# Patient Record
Sex: Female | Born: 1985
Health system: Southern US, Community
[De-identification: ages and names within clinical notes are randomized; demographics above are authoritative.]

## PROBLEM LIST (undated history)

## (undated) DIAGNOSIS — L989 Disorder of the skin and subcutaneous tissue, unspecified: Secondary | ICD-10-CM

## (undated) DIAGNOSIS — D649 Anemia, unspecified: Secondary | ICD-10-CM

## (undated) HISTORY — DX: Disorder of the skin and subcutaneous tissue, unspecified: L98.9

## (undated) HISTORY — PX: VAGINA SURGERY: SHX829

---

## 2010-01-19 ENCOUNTER — Ambulatory Visit: Payer: Self-pay | Admitting: Family

## 2010-06-12 ENCOUNTER — Inpatient Hospital Stay: Payer: Self-pay | Admitting: Obstetrics and Gynecology

## 2012-02-05 IMAGING — US US OB US >=[ID] SNGL FETUS
1 series · 17 of 28 positions shown · non-contrast
Comparison: none

REASON FOR EXAM: dates anatomy placenta location
COMMENTS:

[Series 1: us ob us >=(id) sngl fetus · 17 of 61 slices shown]
[im 1/61]
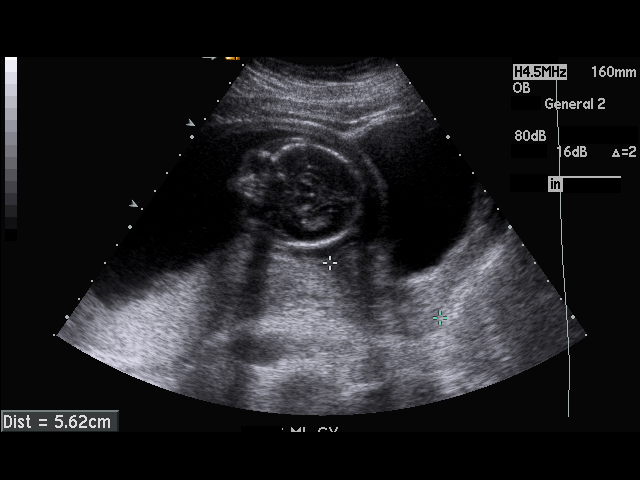
[im 5/61]
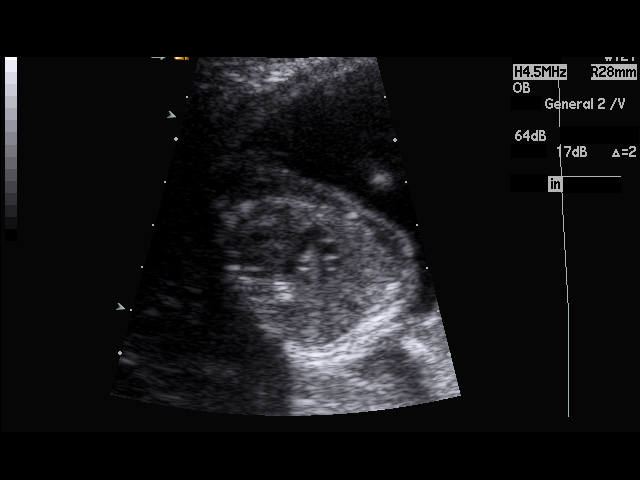
[im 9/61]
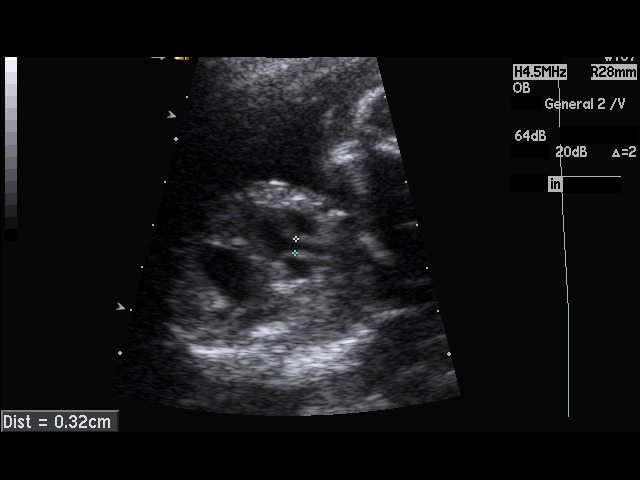
[im 12/61]
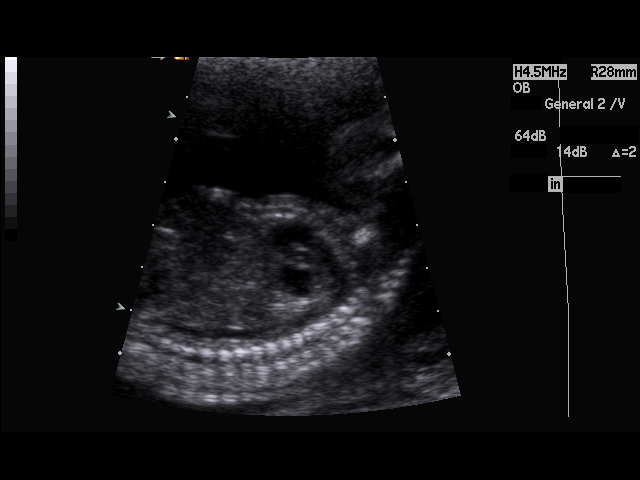
[im 16/61]
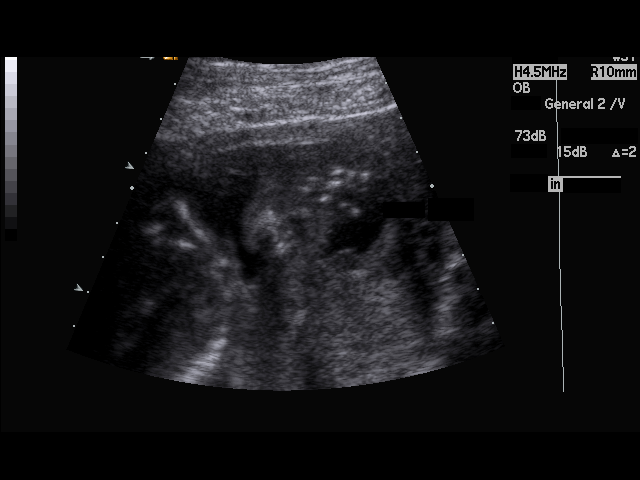
[im 21/61]
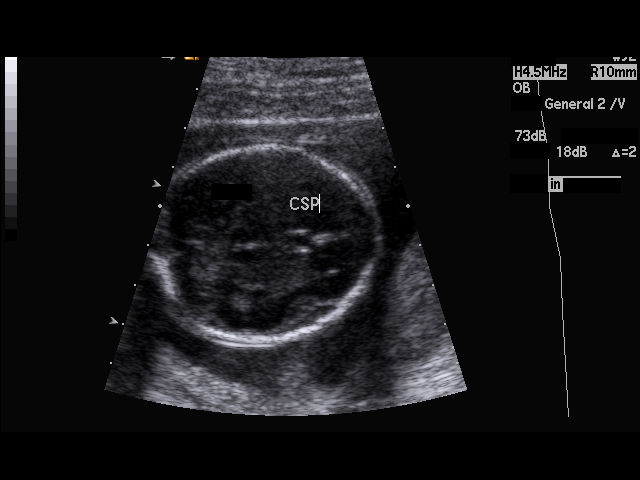
[im 23/61]
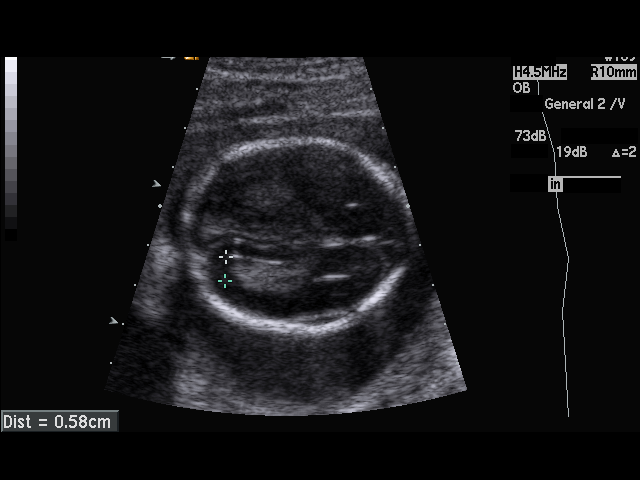
[im 27/61]
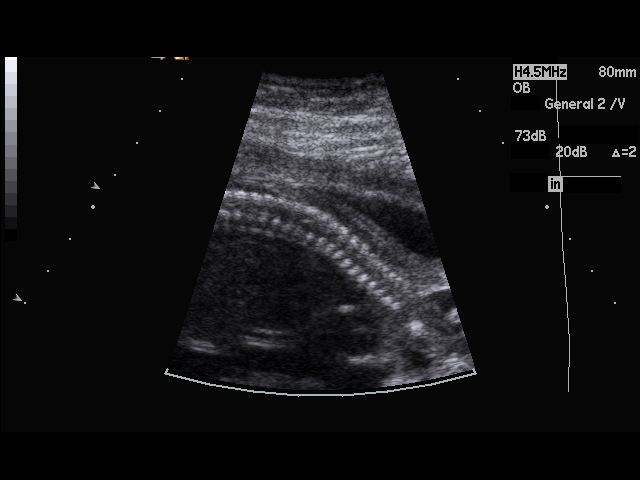
[im 32/61]
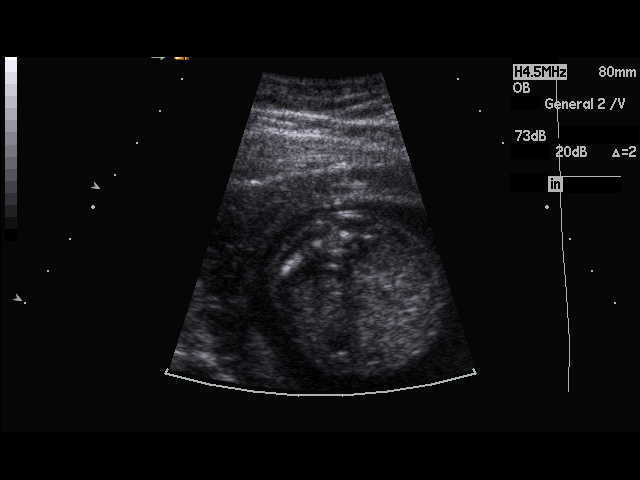
[im 34/61]
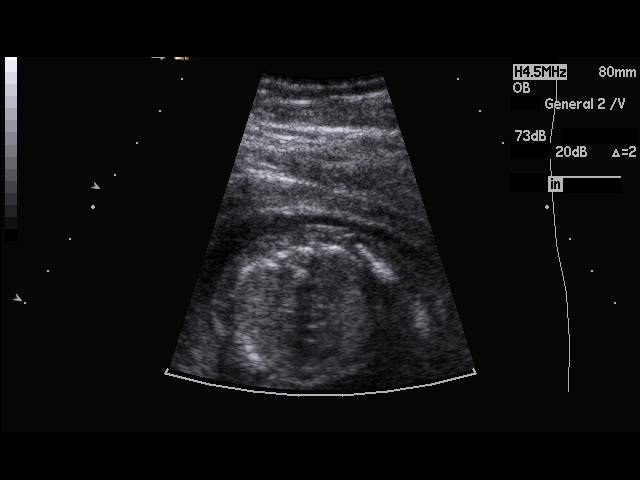
[im 38/61]
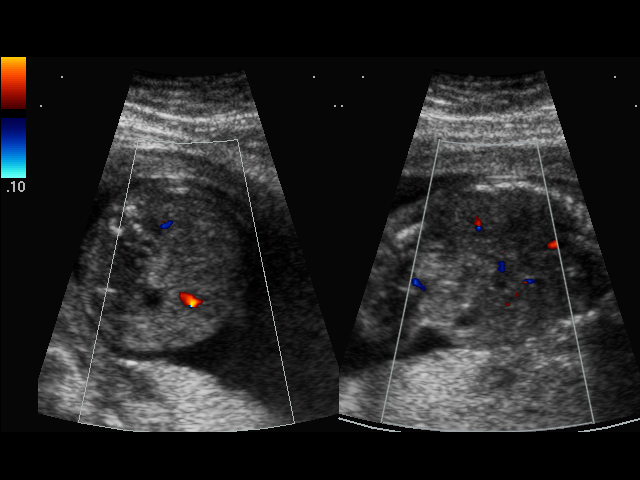
[im 41/61]
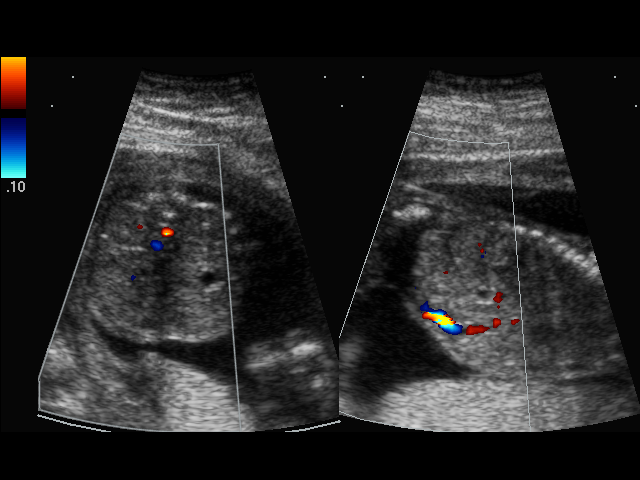
[im 45/61]
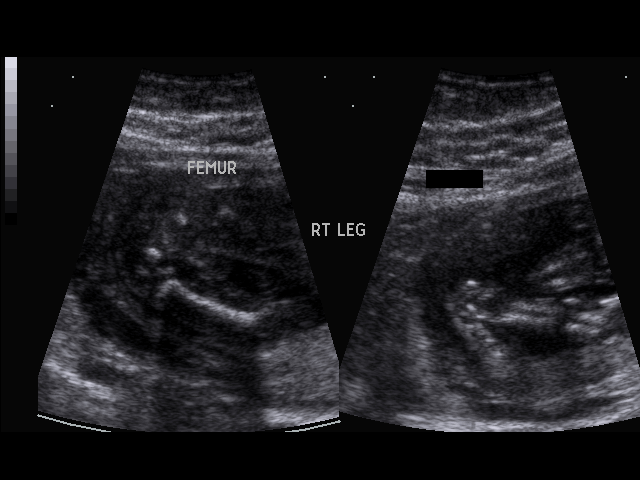
[im 49/61]
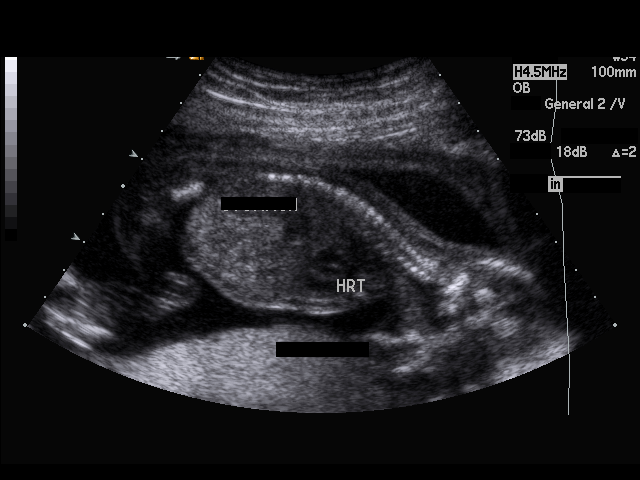
[im 52/61]
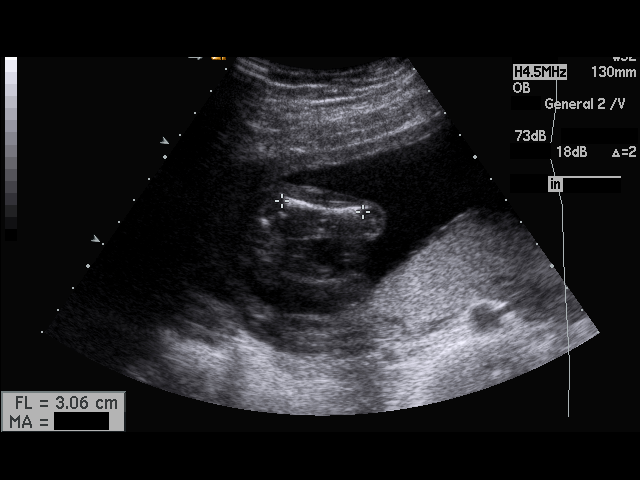
[im 56/61]
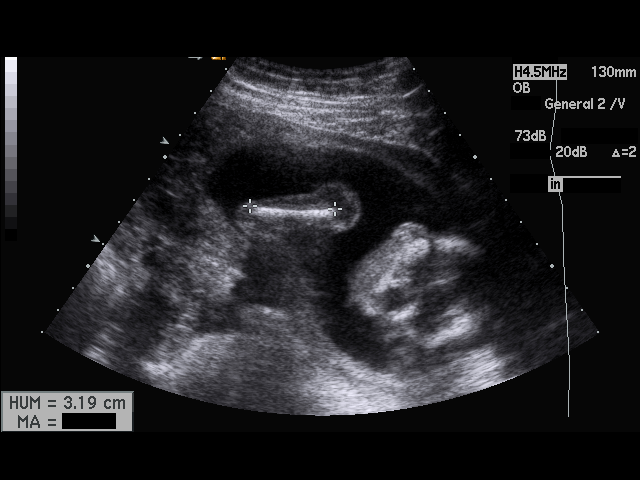
[im 61/61]
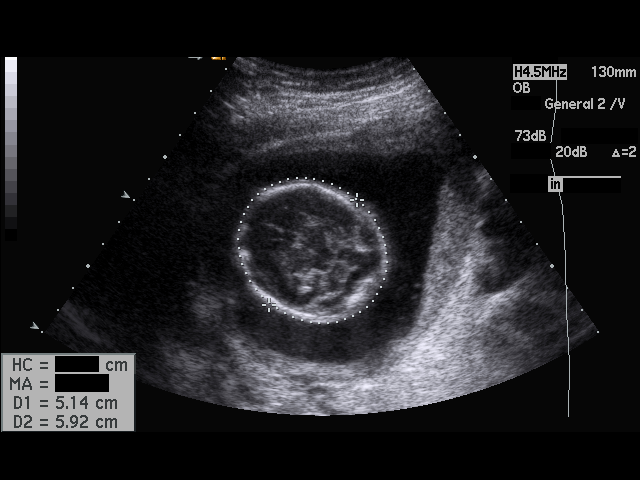

[17 of 28 positions shown; findings below may reference images not displayed]

PROCEDURE:     US  - US OB GREATER/OR EQUAL TO I7VUB  - January 19, 2010  [DATE]

RESULT:     There is observed a single living intrauterine gestation.
Presentation currently is cephalic. Fetal heart rate was monitored at 150
beats per minute. The amniotic fluid volume appears normal. The placenta is
posterior. The inferior tip of the placenta is approximately 4.2 cm from the
cervix. The fetal heart, stomach, and urinary bladder are visualized. No
hydrocephalus or hydronephrosis is seen. No fetal abnormalities are
detected. Fetal measurements are as follows:

BPD: 4.59 cm corresponding to 19 weeks-1 days
HC: 17.39 cm corresponding to 19 weeks-1 days
AC: 15.15 cm corresponding to 20 weeks-7 days
FL: 3.11 cm corresponding to 19 weeks-7 days
HL: 3.13 cm corresponding to 20 weeks-7 days

EFW is 353 grams + / - 47 grams. Average ultrasound menstrual age is 20
weeks-0 days. Ultrasound EDD is June 08, 2010.
IMPRESSION: 1.     Please see above.

## 2013-08-28 LAB — HIV ANTIBODY (ROUTINE TESTING W REFLEX): HIV 1&2 Ab, 4th Generation: NEGATIVE

## 2014-02-23 ENCOUNTER — Inpatient Hospital Stay: Payer: Self-pay | Admitting: Obstetrics and Gynecology

## 2014-02-23 LAB — CBC WITH DIFFERENTIAL/PLATELET
Basophil #: 0.1 10*3/uL (ref 0.0–0.1)
Basophil %: 0.6 %
EOS ABS: 0.1 10*3/uL (ref 0.0–0.7)
Eosinophil %: 0.5 %
HCT: 37.1 % (ref 35.0–47.0)
HGB: 11.8 g/dL — AB (ref 12.0–16.0)
LYMPHS PCT: 13.3 %
Lymphocyte #: 1.5 10*3/uL (ref 1.0–3.6)
MCH: 29.6 pg (ref 26.0–34.0)
MCHC: 31.7 g/dL — AB (ref 32.0–36.0)
MCV: 93 fL (ref 80–100)
Monocyte #: 0.6 x10 3/mm (ref 0.2–0.9)
Monocyte %: 5.3 %
NEUTROS ABS: 9 10*3/uL — AB (ref 1.4–6.5)
Neutrophil %: 80.3 %
Platelet: 148 10*3/uL — ABNORMAL LOW (ref 150–440)
RBC: 3.98 10*6/uL (ref 3.80–5.20)
RDW: 13.7 % (ref 11.5–14.5)
WBC: 11.2 10*3/uL — ABNORMAL HIGH (ref 3.6–11.0)

## 2014-02-23 LAB — GC/CHLAMYDIA PROBE AMP

## 2014-02-24 LAB — HEMATOCRIT: HCT: 29.5 % — ABNORMAL LOW (ref 35.0–47.0)

## 2014-10-14 NOTE — H&P (Signed)
L&D Evaluation:  History:  HPI [age] y/o G2P1001 arrived with c/o regular contractions and SROM clear fluid at 0545. Small amount bloody show, baby is active. [facility]. GBS negative.   Presents with contractions, leaking fluid   Patient's Medical History No Chronic Illness   Patient's Surgical History none   Medications Pre Natal Vitamins   Allergies NKDA   Social History none   Family History Non-Contributory   ROS:  ROS All systems were reviewed.  HEENT, CNS, GI, GU, Respiratory, CV, Renal and Musculoskeletal systems were found to be normal.   Exam:  Vital Signs stable   Urine Protein not completed   General no apparent distress   Mental Status clear   Chest clear   Heart normal sinus rhythm   Abdomen gravid, non-tender   Fetal Position vtx   Fundal Height term   Back no CVAT   Edema no edema   Reflexes 1+   Clonus negative   Pelvic no external lesions, 4-5cm 100% vtx @ -1 clear fluid nl show upon admission   Mebranes Ruptured   Description clear   FHT normal rate with no decels, baseline 120's 130's avg variability   Fetal Heart Rate 132   Ucx regular   Ucx Frequency 2 min   Skin dry   Impression:  Impression active labor   Plan:  Plan monitor contractions and for cervical change   Comments Admitted, knows what to expect, 2nd baby. Family at bedside supportive. Declines pain meds breathing through well.   Electronic Signatures: Albertina ParrLugiano, Kamran Coker B (CNM)  (Signed 20-Sep-15 09:25)  Authored: L&D Evaluation   Last Updated: 20-Sep-15 09:25 by Albertina ParrLugiano, Julliette Frentz B (CNM)

## 2015-11-23 DIAGNOSIS — L700 Acne vulgaris: Secondary | ICD-10-CM | POA: Diagnosis not present

## 2016-03-14 DIAGNOSIS — L700 Acne vulgaris: Secondary | ICD-10-CM | POA: Diagnosis not present

## 2016-06-21 DIAGNOSIS — H5213 Myopia, bilateral: Secondary | ICD-10-CM | POA: Diagnosis not present

## 2016-06-30 DIAGNOSIS — Z124 Encounter for screening for malignant neoplasm of cervix: Secondary | ICD-10-CM | POA: Diagnosis not present

## 2016-06-30 DIAGNOSIS — Z1389 Encounter for screening for other disorder: Secondary | ICD-10-CM | POA: Diagnosis not present

## 2016-06-30 DIAGNOSIS — Z01419 Encounter for gynecological examination (general) (routine) without abnormal findings: Secondary | ICD-10-CM | POA: Diagnosis not present

## 2016-06-30 LAB — HM PAP SMEAR: HM Pap smear: NEGATIVE

## 2017-06-05 DIAGNOSIS — H0014 Chalazion left upper eyelid: Secondary | ICD-10-CM | POA: Diagnosis not present

## 2017-06-23 DIAGNOSIS — H0014 Chalazion left upper eyelid: Secondary | ICD-10-CM | POA: Diagnosis not present

## 2017-10-11 DIAGNOSIS — L0201 Cutaneous abscess of face: Secondary | ICD-10-CM | POA: Diagnosis not present

## 2017-11-16 DIAGNOSIS — Z01419 Encounter for gynecological examination (general) (routine) without abnormal findings: Secondary | ICD-10-CM | POA: Diagnosis not present

## 2018-02-01 ENCOUNTER — Other Ambulatory Visit: Payer: Self-pay

## 2018-02-01 ENCOUNTER — Encounter: Payer: Self-pay | Admitting: Family Medicine

## 2018-02-01 ENCOUNTER — Ambulatory Visit (INDEPENDENT_AMBULATORY_CARE_PROVIDER_SITE_OTHER): Payer: No Typology Code available for payment source | Admitting: Family Medicine

## 2018-02-01 VITALS — BP 102/67 | HR 69 | Temp 98.8°F | Ht 62.0 in | Wt 165.0 lb

## 2018-02-01 DIAGNOSIS — R51 Headache: Secondary | ICD-10-CM | POA: Diagnosis not present

## 2018-02-01 DIAGNOSIS — Z7689 Persons encountering health services in other specified circumstances: Secondary | ICD-10-CM

## 2018-02-01 DIAGNOSIS — H04123 Dry eye syndrome of bilateral lacrimal glands: Secondary | ICD-10-CM | POA: Diagnosis not present

## 2018-02-01 DIAGNOSIS — R519 Headache, unspecified: Secondary | ICD-10-CM

## 2018-02-01 DIAGNOSIS — L700 Acne vulgaris: Secondary | ICD-10-CM | POA: Insufficient documentation

## 2018-02-01 MED ORDER — SUMATRIPTAN SUCCINATE 50 MG PO TABS: 50.0000 mg | ORAL_TABLET | ORAL | 0 refills | Status: DC | PRN

## 2018-02-01 MED ORDER — BENZOYL PEROXIDE 5 % EX LIQD: Freq: Two times a day (BID) | CUTANEOUS | 12 refills | Status: DC

## 2018-02-01 MED ORDER — CYCLOSPORINE 0.05 % OP EMUL: 1.0000 [drp] | Freq: Two times a day (BID) | OPHTHALMIC | 11 refills | Status: DC

## 2018-02-01 NOTE — Progress Notes (Signed)
BP 102/67   Pulse 69   Temp 98.8 F (37.1 C) (Oral)   Ht 5\' 2"  (1.575 m)   Wt 165 lb (74.8 kg)   SpO2 99%   BMI 30.18 kg/m    Subjective:    Patient ID: Meagan White, female    DOB: [birth year], 32 y.o.   MRN: [id number]  HPI: Meagan White is a 32 y.o. female  Chief Complaint  Patient presents with  . New Patient (Initial Visit)    pt would like to discuss dry eyes and headaches   Here today to establish care. No known medical problems and not currently taking any medications or supplements. Has several concerns today.  Headaches once every other week lasting a few hours to a few days at worst. Sometimes triggered by temperature, sometimes by neck soreness. Seems to be at both temples most of the time. Taking ibuprofen which seems to help. Maybe once a year she will vomit with a severe headache but usually no photophobia, phonophobia, confusion, dizziness.   Previously treated for acne by Dermatology. Was on tretinoin cream and antibiotics which helped. Planning to start trying to conceive in the next few months and wants to get back on something that's pregnancy safe.   Occasional red, tired eyes that are worst in the morning. Using two types of OTC lubricating drops. Does get annual eye exams, due for this year's exam.   Gets well woman exams through GYN. Has not had blood work in years.   Relevant past medical, surgical, family and social history reviewed and updated as indicated. Interim medical history since our last visit reviewed. Allergies and medications reviewed and updated.  Review of Systems  Per HPI unless specifically indicated above     Objective:    BP 102/67   Pulse 69   Temp 98.8 F (37.1 C) (Oral)   Ht 5\' 2"  (1.575 m)   Wt 165 lb (74.8 kg)   SpO2 99%   BMI 30.18 kg/m   Wt Readings from Last 3 Encounters:  02/01/18 165 lb (74.8 kg)    Physical Exam  Constitutional: She is oriented to person, place, and time. She appears  well-developed and well-nourished. No distress.  HENT:  Head: Atraumatic.  Eyes: Conjunctivae and EOM are normal.  Neck: Normal range of motion. Neck supple.  Cardiovascular: Normal rate, regular rhythm and normal heart sounds.  Pulmonary/Chest: Effort normal. No respiratory distress.  Musculoskeletal: Normal range of motion.  Neurological: She is alert and oriented to person, place, and time. No cranial nerve deficit.  Skin: Skin is warm and dry.  Scattered acne papules across face  Psychiatric: She has a normal mood and affect. Her behavior is normal.  Nursing note and vitals reviewed.   No results found for this or any previous visit.    Assessment & Plan:   Problem List Items Addressed This Visit      Musculoskeletal and Integument   Acne vulgaris    Will start on benzoyl peroxide wash and add benzaclin gel if needed.       Relevant Medications   benzoyl peroxide (BENZOYL PEROXIDE) 5 % external liquid    Other Visit Diagnoses    Nonintractable episodic headache, unspecified headache type    -  Primary   Unclear whether tension or migraines. Heating pads, massage, change pillows, and will trial imitrex prn along with NSAIDs to assess benefit   Relevant Medications   SUMAtriptan (IMITREX) 50 MG tablet   Encounter  to establish care       Dry eyes       Has failed several OTC lubricant drops, requesting to try restasis. Also recommended systane gel drops. Will schedule annual eye exam soon for f/u       Follow up plan: Return in about 4 weeks (around 03/01/2018) for CPE.

## 2018-02-05 NOTE — Assessment & Plan Note (Signed)
Will start on benzoyl peroxide wash and add benzaclin gel if needed.

## 2018-02-05 NOTE — Patient Instructions (Signed)
Follow up for CPE in 1 month

## 2018-03-26 ENCOUNTER — Ambulatory Visit (INDEPENDENT_AMBULATORY_CARE_PROVIDER_SITE_OTHER): Payer: No Typology Code available for payment source | Admitting: Family Medicine

## 2018-03-26 ENCOUNTER — Encounter: Payer: Self-pay | Admitting: Family Medicine

## 2018-03-26 VITALS — BP 95/64 | HR 66 | Ht 61.6 in | Wt 165.3 lb

## 2018-03-26 DIAGNOSIS — H04123 Dry eye syndrome of bilateral lacrimal glands: Secondary | ICD-10-CM

## 2018-03-26 DIAGNOSIS — L700 Acne vulgaris: Secondary | ICD-10-CM | POA: Diagnosis not present

## 2018-03-26 DIAGNOSIS — Z113 Encounter for screening for infections with a predominantly sexual mode of transmission: Secondary | ICD-10-CM | POA: Diagnosis not present

## 2018-03-26 DIAGNOSIS — G43009 Migraine without aura, not intractable, without status migrainosus: Secondary | ICD-10-CM | POA: Insufficient documentation

## 2018-03-26 DIAGNOSIS — E669 Obesity, unspecified: Secondary | ICD-10-CM

## 2018-03-26 DIAGNOSIS — Z0000 Encounter for general adult medical examination without abnormal findings: Secondary | ICD-10-CM

## 2018-03-26 LAB — UA/M W/RFLX CULTURE, ROUTINE
Bilirubin, UA: NEGATIVE
Glucose, UA: NEGATIVE
Ketones, UA: NEGATIVE
Leukocytes, UA: NEGATIVE
NITRITE UA: NEGATIVE
PH UA: 5.5 (ref 5.0–7.5)
Protein, UA: NEGATIVE
RBC, UA: NEGATIVE
Specific Gravity, UA: 1.025 (ref 1.005–1.030)
Urobilinogen, Ur: 0.2 mg/dL (ref 0.2–1.0)

## 2018-03-26 LAB — OB RESULTS CONSOLE GC/CHLAMYDIA: Gonorrhea: NEGATIVE

## 2018-03-26 NOTE — Assessment & Plan Note (Signed)
Decreasing in frequency, tolerable per pt. Will save imitrex for prn use for severe episodes. OTC remedies for less severe. Will consider adding daily preventative if becoming more frequent or bothersome in the future

## 2018-03-26 NOTE — Assessment & Plan Note (Signed)
Lifestyle modifications reviewed 

## 2018-03-26 NOTE — Patient Instructions (Signed)
Follow up in 1 year.

## 2018-03-26 NOTE — Progress Notes (Addendum)
BP 95/64 (BP Location: Left Arm, Patient Position: Sitting, Cuff Size: Normal)   Pulse 66   Ht 5' 1.6" (1.565 m)   Wt 165 lb 5 oz (75 kg)   LMP 03/19/2018 (Approximate) Comment: Patient states it was not a normal cycle  SpO2 100%   BMI 30.63 kg/m    Subjective:    Patient ID: Meagan White, female    DOB: [birth year], 32 y.o.   MRN: [id number]  HPI: Meagan White is a 32 y.o. female presenting on 03/26/2018 for comprehensive medical examination. Current medical complaints include:see below  Has not been getting as many headaches the past month or so. Imitrex makes her nauseated but did help. Trying not to take it often.   Acne wash helping quite a bit, minimal breakouts since starting it. Tolerating well, not drying her face out too much.   Dry eyes improved with restasis, using prn.   Depression Screen done today and results listed below:  Depression screen [facility] 2/9 02/01/2018  Decreased Interest 0  Down, Depressed, Hopeless 0  PHQ - 2 Score 0  Altered sleeping 0  Tired, decreased energy 1  Change in appetite 0  Feeling bad or failure about yourself  0  Trouble concentrating 0  Moving slowly or fidgety/restless 0  Suicidal thoughts 0  PHQ-9 Score 1    The patient does not have a history of falls. I did not complete a risk assessment for falls. A plan of care for falls was not documented.   Past Medical History:  Past Medical History:  Diagnosis Date  . Skin disease     Surgical History:  Past Surgical History:  Procedure Laterality Date  . VAGINA SURGERY      Medications:  Current Outpatient Medications on File Prior to Visit  Medication Sig  . benzoyl peroxide (BENZOYL PEROXIDE) 5 % external liquid Apply topically 2 (two) times daily.  . cycloSPORINE (RESTASIS) 0.05 % ophthalmic emulsion Place 1 drop into both eyes 2 (two) times daily.  . SUMAtriptan (IMITREX) 50 MG tablet Take 1 tablet (50 mg total) by mouth every 2 (two) hours as needed for  migraine. May repeat in 2 hours if headache persists or recurs.   No current facility-administered medications on file prior to visit.     Allergies:  No Known Allergies  Social History:  Social History   Socioeconomic History  . Marital status: Single    Spouse name: Not on file  . Number of children: Not on file  . Years of education: Not on file  . Highest education level: Not on file  Occupational History  . Not on file  Social Needs  . Financial resource strain: Not on file  . Food insecurity:    Worry: Not on file    Inability: Not on file  . Transportation needs:    Medical: Not on file    Non-medical: Not on file  Tobacco Use  . Smoking status: Never Smoker  . Smokeless tobacco: Never Used  Substance and Sexual Activity  . Alcohol use: Yes  . Drug use: Never  . Sexual activity: Yes  Lifestyle  . Physical activity:    Days per week: Not on file    Minutes per session: Not on file  . Stress: Not on file  Relationships  . Social connections:    Talks on phone: Not on file    Gets together: Not on file    Attends religious service: Not on file  Active member of club or organization: Not on file    Attends meetings of clubs or organizations: Not on file    Relationship status: Not on file  . Intimate partner violence:    Fear of current or ex partner: Not on file    Emotionally abused: Not on file    Physically abused: Not on file    Forced sexual activity: Not on file  Other Topics Concern  . Not on file  Social History Narrative  . Not on file   Social History   Tobacco Use  Smoking Status Never Smoker  Smokeless Tobacco Never Used   Social History   Substance and Sexual Activity  Alcohol Use Yes    Family History:  Family History  Problem Relation Age of Onset  . Hypertension Father     Past medical history, surgical history, medications, allergies, family history and social history reviewed with patient today and changes made to  appropriate areas of the chart.   Review of Systems - General ROS: negative Psychological ROS: negative Ophthalmic ROS: negative ENT ROS: negative Allergy and Immunology ROS: negative Hematological and Lymphatic ROS: negative Endocrine ROS: negative Breast ROS: negative for breast lumps Respiratory ROS: no cough, shortness of breath, or wheezing Cardiovascular ROS: no chest pain or dyspnea on exertion Gastrointestinal ROS: no abdominal pain, change in bowel habits, or black or bloody stools Genito-Urinary ROS: no dysuria, trouble voiding, or hematuria Musculoskeletal ROS: negative Neurological ROS: positive for - headaches Dermatological ROS: negative All other ROS negative except what is listed above and in the HPI.      Objective:    BP 95/64 (BP Location: Left Arm, Patient Position: Sitting, Cuff Size: Normal)   Pulse 66   Ht 5' 1.6" (1.565 m)   Wt 165 lb 5 oz (75 kg)   LMP 03/19/2018 (Approximate) Comment: Patient states it was not a normal cycle  SpO2 100%   BMI 30.63 kg/m   Wt Readings from Last 3 Encounters:  03/26/18 165 lb 5 oz (75 kg)  02/01/18 165 lb (74.8 kg)    Physical Exam  Constitutional: She is oriented to person, place, and time. She appears well-developed and well-nourished. No distress.  HENT:  Head: Atraumatic.  Right Ear: External ear normal.  Left Ear: External ear normal.  Nose: Nose normal.  Mouth/Throat: Oropharynx is clear and moist. No oropharyngeal exudate.  Eyes: Pupils are equal, round, and reactive to light. Conjunctivae are normal. No scleral icterus.  Neck: Normal range of motion. Neck supple. No thyromegaly present.  Cardiovascular: Normal rate, regular rhythm, normal heart sounds and intact distal pulses.  Pulmonary/Chest: Effort normal and breath sounds normal. No respiratory distress.  Breast exam done through GYN  Abdominal: Soft. Bowel sounds are normal. She exhibits no mass. There is no tenderness.  Genitourinary:    Genitourinary Comments: Pelvic exam done through GYN  Musculoskeletal: Normal range of motion. She exhibits no edema or tenderness.  Lymphadenopathy:    She has no cervical adenopathy.  Neurological: She is alert and oriented to person, place, and time. No cranial nerve deficit.  Skin: Skin is warm and dry. No rash noted.  Psychiatric: She has a normal mood and affect. Her behavior is normal.  Nursing note and vitals reviewed.   Results for orders placed or performed in visit on 02/15/18  HM PAP SMEAR  Result Value Ref Range   HM Pap smear Negative   HIV antibody  Result Value Ref Range   HIV 1&2  Ab, 4th Generation Negative       Assessment & Plan:   Problem List Items Addressed This Visit      Cardiovascular and Mediastinum   Migraine without aura and without status migrainosus, not intractable    Decreasing in frequency, tolerable per pt. Will save imitrex for prn use for severe episodes. OTC remedies for less severe. Will consider adding daily preventative if becoming more frequent or bothersome in the future        Musculoskeletal and Integument   Acne vulgaris    Improved with benzoyl peroxide wash. Continue current regimen        Other   Obesity (BMI 30.0-34.9)    Lifestyle modifications reviewed       Other Visit Diagnoses    Dry eyes    -  Primary   Much better on restasis. Continue current regimen   Annual physical exam       Relevant Orders   CBC with Differential/Platelet   Comprehensive metabolic panel   Lipid Panel w/o Chol/HDL Ratio   TSH   UA/M w/rflx Culture, Routine   Routine screening for STI (sexually transmitted infection)       Relevant Orders   HIV Antibody (routine testing w rflx)   RPR   HSV(herpes simplex vrs) 1+2 ab-IgG   GC/Chlamydia Probe Amp       Follow up plan: Return in about 1 year (around 03/27/2019) for CPE.   LABORATORY TESTING:  - Pap smear: done elsewhere  IMMUNIZATIONS:   - Tdap: Tetanus vaccination status  reviewed: last tetanus booster within 10 years. - Influenza: Up to date  PATIENT COUNSELING:   Advised to take 1 mg of folate supplement per day if capable of pregnancy.   Sexuality: Discussed sexually transmitted diseases, partner selection, use of condoms, avoidance of unintended pregnancy  and contraceptive alternatives.   Advised to avoid cigarette smoking.  I discussed with the patient that most people either abstain from alcohol or drink within safe limits (<=14/week and <=4 drinks/occasion for males, <=7/weeks and <= 3 drinks/occasion for females) and that the risk for alcohol disorders and other health effects rises proportionally with the number of drinks per week and how often a drinker exceeds daily limits.  Discussed cessation/primary prevention of drug use and availability of treatment for abuse.   Diet: Encouraged to adjust caloric intake to maintain  or achieve ideal body weight, to reduce intake of dietary saturated fat and total fat, to limit sodium intake by avoiding high sodium foods and not adding table salt, and to maintain adequate dietary potassium and calcium preferably from fresh fruits, vegetables, and low-fat dairy products.    stressed the importance of regular exercise  Injury prevention: Discussed safety belts, safety helmets, smoke detector, smoking near bedding or upholstery.   Dental health: Discussed importance of regular tooth brushing, flossing, and dental visits.    NEXT PREVENTATIVE PHYSICAL DUE IN 1 YEAR. Return in about 1 year (around 03/27/2019) for CPE.

## 2018-03-26 NOTE — Assessment & Plan Note (Signed)
Improved with benzoyl peroxide wash. Continue current regimen

## 2018-03-27 LAB — COMPREHENSIVE METABOLIC PANEL
ALK PHOS: 97 IU/L (ref 39–117)
ALT: 37 IU/L — AB (ref 0–32)
AST: 21 IU/L (ref 0–40)
Albumin/Globulin Ratio: 1.7 (ref 1.2–2.2)
Albumin: 4.6 g/dL (ref 3.5–5.5)
BILIRUBIN TOTAL: 0.4 mg/dL (ref 0.0–1.2)
BUN / CREAT RATIO: 28 — AB (ref 9–23)
BUN: 16 mg/dL (ref 6–20)
CO2: 24 mmol/L (ref 20–29)
Calcium: 9.5 mg/dL (ref 8.7–10.2)
Chloride: 103 mmol/L (ref 96–106)
Creatinine, Ser: 0.58 mg/dL (ref 0.57–1.00)
GFR calc Af Amer: 143 mL/min/{1.73_m2} (ref >59)
GFR calc non Af Amer: 124 mL/min/{1.73_m2} (ref >59)
Globulin, Total: 2.7 g/dL (ref 1.5–4.5)
Glucose: 94 mg/dL (ref 65–99)
Potassium: 4 mmol/L (ref 3.5–5.2)
Sodium: 141 mmol/L (ref 134–144)
Total Protein: 7.3 g/dL (ref 6.0–8.5)

## 2018-03-27 LAB — CBC WITH DIFFERENTIAL/PLATELET
BASOS ABS: 0 10*3/uL (ref 0.0–0.2)
Basos: 0 %
EOS (ABSOLUTE): 0.1 10*3/uL (ref 0.0–0.4)
Eos: 2 %
Hematocrit: 37.9 % (ref 34.0–46.6)
Hemoglobin: 12.3 g/dL (ref 11.1–15.9)
Immature Grans (Abs): 0 10*3/uL (ref 0.0–0.1)
Immature Granulocytes: 0 %
LYMPHS: 26 %
Lymphocytes Absolute: 1.9 10*3/uL (ref 0.7–3.1)
MCH: 27.6 pg (ref 26.6–33.0)
MCHC: 32.5 g/dL (ref 31.5–35.7)
MCV: 85 fL (ref 79–97)
MONOS ABS: 0.6 10*3/uL (ref 0.1–0.9)
Monocytes: 8 %
Neutrophils Absolute: 4.5 10*3/uL (ref 1.4–7.0)
Neutrophils: 64 %
Platelets: 267 10*3/uL (ref 150–450)
RBC: 4.45 x10E6/uL (ref 3.77–5.28)
RDW: 12.9 % (ref 12.3–15.4)
WBC: 7.1 10*3/uL (ref 3.4–10.8)

## 2018-03-27 LAB — LIPID PANEL W/O CHOL/HDL RATIO
Cholesterol, Total: 183 mg/dL (ref 100–199)
HDL: 50 mg/dL (ref >39)
LDL CALC: 113 mg/dL — AB (ref 0–99)
TRIGLYCERIDES: 99 mg/dL (ref 0–149)
VLDL Cholesterol Cal: 20 mg/dL (ref 5–40)

## 2018-03-27 LAB — HSV(HERPES SIMPLEX VRS) I + II AB-IGG: HSV 1 GLYCOPROTEIN G AB, IGG: 36.5 {index} — AB (ref 0.00–0.90)

## 2018-03-27 LAB — HIV ANTIBODY (ROUTINE TESTING W REFLEX): HIV SCREEN 4TH GENERATION: NONREACTIVE

## 2018-03-27 LAB — TSH: TSH: 4.32 u[IU]/mL (ref 0.450–4.500)

## 2018-03-27 LAB — RPR: RPR: NONREACTIVE

## 2018-03-28 LAB — GC/CHLAMYDIA PROBE AMP
CHLAMYDIA, DNA PROBE: NEGATIVE
NEISSERIA GONORRHOEAE BY PCR: NEGATIVE

## 2018-06-06 NOTE — L&D Delivery Note (Signed)
Delivery Note  First Stage: Labor onset: 0300 Induction: cytotec buccal and vaginal x 1 dose, Pitocin and AROM Analgesia /Anesthesia intrapartum: none AROM at 0617  Second Stage: Complete dilation at 0715 Onset of pushing at 0715 FHR second stage Cat II variable with pushes.   Delivery of a viable female infant on 12/14/18 at 0726 by Hassan Buckler CNM delivery of fetal head in ROP position with restitution to ROT. No nuchal cord;  Anterior then posterior shoulders delivered easily with gentle downward traction. Baby placed on mom's chest, and attended to by peds.  Cord double clamped after cessation of pulsation, cut by FOB Cord blood sample collected   Third Stage: Placenta delivered spontaneously intact with 3VC @ 0732 Placenta disposition: routine disposal Uterine tone initially boggy with brisk bleeding, massaged firm, after repair-massaged several clots, bleeding small  2nd deg perineal laceration identified  Anesthesia for repair: local Repair: 3-0 Vicryl Rapide CT-1 Est. Blood Loss (mL): 491PH  Complications: initial brisk bleeding  Mom to postpartum.  Baby to Couplet care / Skin to Skin.  Newborn: Birth Weight: pending  Apgar Scores: 8/9 Feeding planned: breast and formula

## 2018-06-19 LAB — OB RESULTS CONSOLE HEPATITIS B SURFACE ANTIGEN: Hepatitis B Surface Ag: NEGATIVE

## 2018-06-19 LAB — OB RESULTS CONSOLE RUBELLA ANTIBODY, IGM: Rubella: IMMUNE

## 2018-06-19 LAB — OB RESULTS CONSOLE VARICELLA ZOSTER ANTIBODY, IGG: Varicella: IMMUNE

## 2018-06-25 ENCOUNTER — Encounter: Payer: Self-pay | Admitting: Family Medicine

## 2018-06-25 ENCOUNTER — Ambulatory Visit (INDEPENDENT_AMBULATORY_CARE_PROVIDER_SITE_OTHER): Payer: No Typology Code available for payment source | Admitting: Family Medicine

## 2018-06-25 VITALS — BP 102/68 | HR 73 | Temp 98.5°F | Ht 61.6 in | Wt 163.1 lb

## 2018-06-25 DIAGNOSIS — L719 Rosacea, unspecified: Secondary | ICD-10-CM | POA: Diagnosis not present

## 2018-06-25 DIAGNOSIS — H6123 Impacted cerumen, bilateral: Secondary | ICD-10-CM

## 2018-06-25 NOTE — Patient Instructions (Signed)
Debrox drops several times daily for a few weeks

## 2018-06-25 NOTE — Progress Notes (Signed)
BP 102/68 (BP Location: Right Arm, Patient Position: Sitting, Cuff Size: Normal)   Pulse 73   Temp 98.5 F (36.9 C)   Ht 5' 1.6" (1.565 m)   Wt 163 lb 2 oz (74 kg)   LMP 03/19/2018 (Approximate) Comment: Patient states it was not a normal cycle  SpO2 98%   BMI 30.22 kg/m    Subjective:    Patient ID: Meagan White, female    DOB: 1985-09-05, 33 y.o.   MRN: [id number]  HPI: Meagan White is a 33 y.o. female  Chief Complaint  Patient presents with  . Tinnitus    Left eat X 2 weeks   Here today with 2 weeks of tinnitus of left ear. Denies pain, pressure, decreased hearing, discharge, dizziness, new medication changes. Has not tried anything OTC for sxs.  Still struggling with her rosacea. Tried benzoyl peroxide solution and moisturizers with no relief. Requesting Dermatology referral.   Of note, pt is currently [redacted] wks pregnant.    Relevant past medical, surgical, family and social history reviewed and updated as indicated. Interim medical history since our last visit reviewed. Allergies and medications reviewed and updated.  Review of Systems  Per HPI unless specifically indicated above     Objective:    BP 102/68 (BP Location: Right Arm, Patient Position: Sitting, Cuff Size: Normal)   Pulse 73   Temp 98.5 F (36.9 C)   Ht 5' 1.6" (1.565 m)   Wt 163 lb 2 oz (74 kg)   LMP 03/19/2018 (Approximate) Comment: Patient states it was not a normal cycle  SpO2 98%   BMI 30.22 kg/m   Wt Readings from Last 3 Encounters:  06/25/18 163 lb 2 oz (74 kg)  03/26/18 165 lb 5 oz (75 kg)  02/01/18 165 lb (74.8 kg)    Physical Exam Vitals signs and nursing note reviewed.  Constitutional:      Appearance: Normal appearance. She is not ill-appearing.  HENT:     Head: Atraumatic.     Comments: B/l cerumen impaction Eyes:     Extraocular Movements: Extraocular movements intact.     Conjunctiva/sclera: Conjunctivae normal.  Neck:     Musculoskeletal: Normal range of  motion and neck supple.  Cardiovascular:     Rate and Rhythm: Normal rate and regular rhythm.     Heart sounds: Normal heart sounds.  Pulmonary:     Effort: Pulmonary effort is normal.     Breath sounds: Normal breath sounds.  Musculoskeletal: Normal range of motion.  Skin:    General: Skin is warm and dry.     Comments: Erythema and papules across b/l cheeks  Neurological:     Mental Status: She is alert and oriented to person, place, and time.  Psychiatric:        Mood and Affect: Mood normal.        Thought Content: Thought content normal.        Judgment: Judgment normal.     Procedure: B/l ear lavage Procedure explained in detail, questions answered adequately. Warm water used to lavage ears with full clearance of left EAC, partial on right. TM visualized on the left and benign. Not visualized on right. Tolerated procedure well with no immediate complications noted.  Results for orders placed or performed in visit on 03/26/18  GC/Chlamydia Probe Amp  Result Value Ref Range   Chlamydia trachomatis, NAA Negative Negative   Neisseria gonorrhoeae by PCR Negative Negative  CBC with Differential/Platelet  Result Value  Ref Range   WBC 7.1 3.4 - 10.8 x10E3/uL   RBC 4.45 3.77 - 5.28 x10E6/uL   Hemoglobin 12.3 11.1 - 15.9 g/dL   Hematocrit 16.1 09.6 - 46.6 %   MCV 85 79 - 97 fL   MCH 27.6 26.6 - 33.0 pg   MCHC 32.5 31.5 - 35.7 g/dL   RDW 04.5 40.9 - 81.1 %   Platelets 267 150 - 450 x10E3/uL   Neutrophils 64 Not Estab. %   Lymphs 26 Not Estab. %   Monocytes 8 Not Estab. %   Eos 2 Not Estab. %   Basos 0 Not Estab. %   Neutrophils Absolute 4.5 1.4 - 7.0 x10E3/uL   Lymphocytes Absolute 1.9 0.7 - 3.1 x10E3/uL   Monocytes Absolute 0.6 0.1 - 0.9 x10E3/uL   EOS (ABSOLUTE) 0.1 0.0 - 0.4 x10E3/uL   Basophils Absolute 0.0 0.0 - 0.2 x10E3/uL   Immature Granulocytes 0 Not Estab. %   Immature Grans (Abs) 0.0 0.0 - 0.1 x10E3/uL  Comprehensive metabolic panel  Result Value Ref Range    Glucose 94 65 - 99 mg/dL   BUN 16 6 - 20 mg/dL   Creatinine, Ser 9.14 0.57 - 1.00 mg/dL   GFR calc non Af Amer 124 >59 mL/min/1.73   GFR calc Af Amer 143 >59 mL/min/1.73   BUN/Creatinine Ratio 28 (H) 9 - 23   Sodium 141 134 - 144 mmol/L   Potassium 4.0 3.5 - 5.2 mmol/L   Chloride 103 96 - 106 mmol/L   CO2 24 20 - 29 mmol/L   Calcium 9.5 8.7 - 10.2 mg/dL   Total Protein 7.3 6.0 - 8.5 g/dL   Albumin 4.6 3.5 - 5.5 g/dL   Globulin, Total 2.7 1.5 - 4.5 g/dL   Albumin/Globulin Ratio 1.7 1.2 - 2.2   Bilirubin Total 0.4 0.0 - 1.2 mg/dL   Alkaline Phosphatase 97 39 - 117 IU/L   AST 21 0 - 40 IU/L   ALT 37 (H) 0 - 32 IU/L  Lipid Panel w/o Chol/HDL Ratio  Result Value Ref Range   Cholesterol, Total 183 100 - 199 mg/dL   Triglycerides 99 0 - 149 mg/dL   HDL 50 >78 mg/dL   VLDL Cholesterol Cal 20 5 - 40 mg/dL   LDL Calculated 295 (H) 0 - 99 mg/dL  TSH  Result Value Ref Range   TSH 4.320 0.450 - 4.500 uIU/mL  UA/M w/rflx Culture, Routine  Result Value Ref Range   Specific Gravity, UA 1.025 1.005 - 1.030   pH, UA 5.5 5.0 - 7.5   Color, UA Yellow Yellow   Appearance Ur Clear Clear   Leukocytes, UA Negative Negative   Protein, UA Negative Negative/Trace   Glucose, UA Negative Negative   Ketones, UA Negative Negative   RBC, UA Negative Negative   Bilirubin, UA Negative Negative   Urobilinogen, Ur 0.2 0.2 - 1.0 mg/dL   Nitrite, UA Negative Negative  HIV Antibody (routine testing w rflx)  Result Value Ref Range   HIV Screen 4th Generation wRfx Non Reactive Non Reactive  RPR  Result Value Ref Range   RPR Ser Ql Non Reactive Non Reactive  HSV(herpes simplex vrs) 1+2 ab-IgG  Result Value Ref Range   HSV 1 Glycoprotein G Ab, IgG 36.50 (H) 0.00 - 0.90 index   HSV 2 IgG, Type Spec <0.91 0.00 - 0.90 index      Assessment & Plan:   Problem List Items Addressed This Visit  Musculoskeletal and Integument   Rosacea    Will hold off on starting metrogel as she's currently pregnant  and safety is not well established. Continue good moisturizers, referral to Dermatology placed to evaluate safest treatment option at this time.       Relevant Orders   Ambulatory referral to Dermatology    Other Visit Diagnoses    Bilateral impacted cerumen    -  Primary   Lavage performed b/l with good clearance of left, partial clearance of right. Try debrox drops x 2 weeks and f/u for retry right ear       Follow up plan: Return in about 2 weeks (around 07/09/2018) for ear lavage.

## 2018-06-29 DIAGNOSIS — L719 Rosacea, unspecified: Secondary | ICD-10-CM | POA: Insufficient documentation

## 2018-06-29 NOTE — Assessment & Plan Note (Signed)
Will hold off on starting metrogel as she's currently pregnant and safety is not well established. Continue good moisturizers, referral to Dermatology placed to evaluate safest treatment option at this time.

## 2018-07-10 ENCOUNTER — Encounter: Payer: Self-pay | Admitting: Family Medicine

## 2018-07-11 ENCOUNTER — Other Ambulatory Visit: Payer: Self-pay | Admitting: Family Medicine

## 2018-07-11 DIAGNOSIS — H9312 Tinnitus, left ear: Secondary | ICD-10-CM

## 2018-12-13 ENCOUNTER — Inpatient Hospital Stay
Admission: EM | Admit: 2018-12-13 | Discharge: 2018-12-15 | DRG: 805 | Disposition: A | Discharge status: Home / Self Care | Payer: No Typology Code available for payment source | Attending: Obstetrics and Gynecology | Admitting: Obstetrics and Gynecology

## 2018-12-13 ENCOUNTER — Other Ambulatory Visit: Payer: Self-pay

## 2018-12-13 DIAGNOSIS — K831 Obstruction of bile duct: Secondary | ICD-10-CM | POA: Diagnosis present

## 2018-12-13 DIAGNOSIS — O99354 Diseases of the nervous system complicating childbirth: Secondary | ICD-10-CM | POA: Diagnosis present

## 2018-12-13 DIAGNOSIS — Z3A38 38 weeks gestation of pregnancy: Secondary | ICD-10-CM

## 2018-12-13 DIAGNOSIS — G43909 Migraine, unspecified, not intractable, without status migrainosus: Secondary | ICD-10-CM | POA: Diagnosis present

## 2018-12-13 DIAGNOSIS — Z1159 Encounter for screening for other viral diseases: Secondary | ICD-10-CM | POA: Diagnosis not present

## 2018-12-13 DIAGNOSIS — O2662 Liver and biliary tract disorders in childbirth: Secondary | ICD-10-CM | POA: Diagnosis present

## 2018-12-13 HISTORY — DX: Anemia, unspecified: D64.9

## 2018-12-13 LAB — COMPREHENSIVE METABOLIC PANEL
ALT: 37 U/L (ref 0–44)
AST: 38 U/L (ref 15–41)
Albumin: 3.1 g/dL — ABNORMAL LOW (ref 3.5–5.0)
Alkaline Phosphatase: 214 U/L — ABNORMAL HIGH (ref 38–126)
Anion gap: 10 (ref 5–15)
BUN: 12 mg/dL (ref 6–20)
CO2: 19 mmol/L — ABNORMAL LOW (ref 22–32)
Calcium: 8.5 mg/dL — ABNORMAL LOW (ref 8.9–10.3)
Chloride: 107 mmol/L (ref 98–111)
Creatinine, Ser: 0.5 mg/dL (ref 0.44–1.00)
GFR calc Af Amer: >60 mL/min (ref >60)
GFR calc non Af Amer: >60 mL/min (ref >60)
Glucose, Bld: 97 mg/dL (ref 70–99)
Potassium: 3.7 mmol/L (ref 3.5–5.1)
Sodium: 136 mmol/L (ref 135–145)
Total Bilirubin: 0.5 mg/dL (ref 0.3–1.2)
Total Protein: 6.4 g/dL — ABNORMAL LOW (ref 6.5–8.1)

## 2018-12-13 LAB — TYPE AND SCREEN
ABO/RH(D): O POS
Antibody Screen: NEGATIVE

## 2018-12-13 LAB — CBC
HCT: 33.4 % — ABNORMAL LOW (ref 36.0–46.0)
Hemoglobin: 11.4 g/dL — ABNORMAL LOW (ref 12.0–15.0)
MCH: 30.9 pg (ref 26.0–34.0)
MCHC: 34.1 g/dL (ref 30.0–36.0)
MCV: 90.5 fL (ref 80.0–100.0)
Platelets: 151 10*3/uL (ref 150–400)
RBC: 3.69 MIL/uL — ABNORMAL LOW (ref 3.87–5.11)
RDW: 12.7 % (ref 11.5–15.5)
WBC: 7.9 10*3/uL (ref 4.0–10.5)
nRBC: 0 % (ref 0.0–0.2)

## 2018-12-13 LAB — PROTEIN / CREATININE RATIO, URINE
Creatinine, Urine: 49 mg/dL
Total Protein, Urine: <6 mg/dL

## 2018-12-13 LAB — SARS CORONAVIRUS 2 BY RT PCR (HOSPITAL ORDER, PERFORMED IN ~~LOC~~ HOSPITAL LAB): SARS Coronavirus 2: NEGATIVE

## 2018-12-13 MED ORDER — LACTATED RINGERS IV SOLN: 500.0000 mL | INTRAVENOUS | Status: DC | PRN

## 2018-12-13 MED ORDER — SOD CITRATE-CITRIC ACID 500-334 MG/5ML PO SOLN: 30.0000 mL | ORAL | Status: DC | PRN

## 2018-12-13 MED ORDER — OXYTOCIN 40 UNITS IN NORMAL SALINE INFUSION - SIMPLE MED
1.0000 m[IU]/min | INTRAVENOUS | Status: DC
  Administered 2018-12-14: 2 m[IU]/min via INTRAVENOUS

## 2018-12-13 MED ORDER — MISOPROSTOL 25 MCG QUARTER TABLET
25.0000 ug | ORAL_TABLET | ORAL | Status: DC | PRN
  Administered 2018-12-13: 25 ug via VAGINAL
  Filled 2018-12-13: qty 1

## 2018-12-13 MED ORDER — ONDANSETRON HCL 4 MG/2ML IJ SOLN: 4.0000 mg | Freq: Four times a day (QID) | INTRAMUSCULAR | Status: DC | PRN

## 2018-12-13 MED ORDER — OXYTOCIN 40 UNITS IN NORMAL SALINE INFUSION - SIMPLE MED: 2.5000 [IU]/h | INTRAVENOUS | Status: DC

## 2018-12-13 MED ORDER — TERBUTALINE SULFATE 1 MG/ML IJ SOLN: 0.2500 mg | Freq: Once | INTRAMUSCULAR | Status: DC | PRN

## 2018-12-13 MED ORDER — OXYTOCIN BOLUS FROM INFUSION
500.0000 mL | Freq: Once | INTRAVENOUS | Status: AC
  Administered 2018-12-14: 500 mL via INTRAVENOUS

## 2018-12-13 MED ORDER — BUTORPHANOL TARTRATE 2 MG/ML IJ SOLN
1.0000 mg | INTRAMUSCULAR | Status: DC | PRN
  Filled 2018-12-13: qty 1

## 2018-12-13 MED ORDER — MISOPROSTOL 25 MCG QUARTER TABLET
25.0000 ug | ORAL_TABLET | ORAL | Status: DC | PRN
  Administered 2018-12-13: 25 ug via BUCCAL
  Filled 2018-12-13: qty 1

## 2018-12-13 MED ORDER — OXYCODONE-ACETAMINOPHEN 5-325 MG PO TABS: 2.0000 | ORAL_TABLET | ORAL | Status: DC | PRN

## 2018-12-13 MED ORDER — LACTATED RINGERS IV SOLN
INTRAVENOUS | Status: DC
  Administered 2018-12-13 – 2018-12-14 (×2): via INTRAVENOUS

## 2018-12-13 MED ORDER — LIDOCAINE HCL (PF) 1 % IJ SOLN
30.0000 mL | INTRAMUSCULAR | Status: AC | PRN
  Administered 2018-12-14: 30 mL via SUBCUTANEOUS

## 2018-12-13 MED ORDER — ACETAMINOPHEN 325 MG PO TABS: 650.0000 mg | ORAL_TABLET | ORAL | Status: DC | PRN

## 2018-12-13 NOTE — H&P (Addendum)
OB History & Physical   History of Present Illness:  Chief Complaint: induction  HPI:  Meagan White is a 33 y.o. 173P2002 female at 3069w5d dated by US at 4465w3d.  She presents to L&D for induction of labor due to increased itching and presumed cholestasis at term. Pt reports active FM, denies ctx LOF or bloody show. Continues to have itching of palms and soles that has worsened over last week.     Pregnancy Issues: 1. Cholestasis of pregnancy at 38.5wks 2. Migraines   Maternal Medical History:   Past Medical History:  Diagnosis Date  . Anemia   . Skin disease     Past Surgical History:  Procedure Laterality Date  . VAGINA SURGERY      No Known Allergies  Prior to Admission medications   Medication Sig Start Date End Date Taking? Authorizing Provider  azelaic acid (AZELEX) 20 % cream Apply topically 1 day or 1 dose. After skin is thoroughly washed and patted dry, gently but thoroughly massage a thin film of azelaic acid cream into the affected area twice daily, in the morning and evening.   Yes [provider]  clindamycin (CLEOCIN T) 1 % external solution Apply topically every morning.   Yes [provider]  ferrous sulfate 325 (65 FE) MG tablet Take 325 mg by mouth daily with breakfast.   Yes [provider]  Prenat-FeFum-DSS-FA-DHA w/o A (PNV-DHA+DOCUSATE) 27-1.25-300 MG CAPS Take by mouth.   Yes [provider]  benzoyl peroxide (BENZOYL PEROXIDE) 5 % external liquid Apply topically 2 (two) times daily. Patient not taking: Reported on 06/25/2018 02/01/18   Particia NearingLane, [name], PA-C  cycloSPORINE (RESTASIS) 0.05 % ophthalmic emulsion Place 1 drop into both eyes 2 (two) times daily. Patient not taking: Reported on 06/25/2018 02/01/18   Particia NearingLane, [name], PA-C  promethazine [employer](PHENERGAN) 25 MG tablet Take by mouth. 05/22/18   [provider]  pyridOXINE (VITAMIN B-6) 25 MG tablet Take by mouth. 05/22/18   [provider]  SUMAtriptan (IMITREX) 50 MG tablet Take 1 tablet (50 mg total) by mouth every 2 (two) hours as needed for migraine. May repeat in 2 hours if headache persists or recurs. Patient not taking: Reported on 06/25/2018 02/01/18   Particia NearingLane, [name], PA-C     [facility]  Social History: She  reports that she has never smoked. She has never used smokeless tobacco. She reports current alcohol use. She reports that she does not use drugs.  Family History: family history includes Hypertension in her father. no family hx Gyn cancers  Review of Systems: A full review of systems was performed and negative except as noted in the HPI.     Physical Exam:  Vital Signs: BP 111/70 (BP Location: Left Arm)   Pulse 94   Temp 98.4 F (36.9 C) (Oral)   Resp 16   Ht 5\' 2"  (1.575 m)   Wt 76.7 kg   LMP 02/14/2018 Comment: Patient states it was not a normal cycle  BMI 30.91 kg/m    General: no acute distress.  HEENT: normocephalic, atraumatic Heart: regular rate & rhythm.  No murmurs/rubs/gallops Lungs: clear to auscultation bilaterally, normal respiratory effort Abdomen: soft, gravid, non-tender;  EFW: 7lbs Pelvic:   External: Normal external female genitalia  Cervix: Dilation: 3 / Effacement (%): 60 / Station: -3    Extremities: non-tender, symmetric, no edema bilaterally.  DTRs: 2+  Neurologic: Alert & oriented x 3.  Results for orders placed or performed during the hospital encounter of 12/13/18 (from the past 24 hour(s))  CBC     Status: Abnormal   Collection Time: 12/13/18 10:10 PM  Result Value Ref Range   WBC 7.9 4.0 - 10.5 K/uL   RBC 3.69 (L) 3.87 - 5.11 MIL/uL   Hemoglobin 11.4 (L) 12.0 - 15.0 g/dL   HCT 40.933.4 (L) 81.136.0 - 91.446.0 %   MCV 90.5 80.0 - 100.0 fL   MCH 30.9 26.0 - 34.0 pg   MCHC 34.1 30.0 - 36.0 g/dL   RDW 78.212.7 95.611.5 - 21.315.5 %   Platelets 151 150 - 400 K/uL   nRBC 0.0 0.0 - 0.2 %  Type and screen [facility]      Status: None (Preliminary result)   Collection Time: 12/13/18 10:10 PM  Result Value Ref Range   ABO/RH(D) PENDING    Antibody Screen PENDING    Sample Expiration      12/16/2018,2359 Performed at [facility], [address] [place], [place] [address]   Comprehensive metabolic panel     Status: Abnormal   Collection Time: 12/13/18 10:10 PM  Result Value Ref Range   Sodium 136 135 - 145 mmol/L   Potassium 3.7 3.5 - 5.1 mmol/L   Chloride 107 98 - 111 mmol/L   CO2 19 (L) 22 - 32 mmol/L   Glucose, Bld 97 70 - 99 mg/dL   BUN 12 6 - 20 mg/dL   Creatinine, Ser 8.460.50 0.44 - 1.00 mg/dL   Calcium 8.5 (L) 8.9 - 10.3 mg/dL   Total Protein 6.4 (L) 6.5 - 8.1 g/dL   Albumin 3.1 (L) 3.5 - 5.0 g/dL   AST 38 15 - 41 U/L   ALT 37 0 - 44 U/L   Alkaline Phosphatase 214 (H) 38 - 126 U/L   Total Bilirubin 0.5 0.3 - 1.2 mg/dL   GFR calc non Af Amer >60 >60 mL/min   GFR calc Af Amer >60 >60 mL/min   Anion gap 10 5 - 15    Pertinent Results:  Prenatal Labs: Blood type/Rh  O pos  Antibody screen neg  Rubella Immune  Varicella Immune  RPR NR  HBsAg Neg  HIV NR  GC neg  Chlamydia neg  Genetic screening Negative AFP-Tetra  1 hour GTT  79  GBS negative   FHT:  135bpm, mod variability, + accels, no decels TOCO: uterine irritability, occasional UC SVE:  Dilation: 3 / Effacement (%): 60 / Station: -3    Cephalic by leopolds  No results found.  Assessment:  Meagan White is a 33 y.o. 563P2002 female at 2244w5d with Cholestasis of pregnancy.   Plan:  1. Admit to Labor & Delivery; consents reviewed and obtained - D/W Dr Dalbert Garnetbeasley, planned admission, bile acids done in office today.  - COVID test done, pending.   2. Fetal Well being  - Fetal Tracing: Cat I tracing - Group B Streptococcus ppx indicated: neg - Presentation: cephalic confirmed by exam   3. Routine OB: - Prenatal labs reviewed, as above - Rh O Pos - CBC, T&S, RPR on admit - Clear fluids, IVF  4. Monitoring  of Labor -  Contractions: external toco in place -  Pelvis proven to 7#6 -  Plan for induction with cytotec vaginal/buccal.  -  Plan for continuous fetal monitoring  -  Maternal pain control as desired; requesting IVPM - Anticipate vaginal delivery  5. Post Partum Planning: - Infant  feeding: breast/formula - Contraception: condoms  Francetta Found, CNM 12/13/18 11:00 PM

## 2018-12-14 MED ORDER — ONDANSETRON HCL 4 MG/2ML IJ SOLN: 4.0000 mg | INTRAMUSCULAR | Status: DC | PRN

## 2018-12-14 MED ORDER — SIMETHICONE 80 MG PO CHEW: 160.0000 mg | CHEWABLE_TABLET | ORAL | Status: DC | PRN

## 2018-12-14 MED ORDER — WITCH HAZEL-GLYCERIN EX PADS: 1.0000 "application " | MEDICATED_PAD | CUTANEOUS | Status: DC | PRN

## 2018-12-14 MED ORDER — LIDOCAINE HCL (PF) 1 % IJ SOLN
INTRAMUSCULAR | Status: AC
  Filled 2018-12-14: qty 30

## 2018-12-14 MED ORDER — IBUPROFEN 600 MG PO TABS
600.0000 mg | ORAL_TABLET | Freq: Four times a day (QID) | ORAL | Status: DC
  Administered 2018-12-14 – 2018-12-15 (×5): 600 mg via ORAL
  Filled 2018-12-14 (×5): qty 1

## 2018-12-14 MED ORDER — DIPHENHYDRAMINE HCL 25 MG PO CAPS: 25.0000 mg | ORAL_CAPSULE | Freq: Four times a day (QID) | ORAL | Status: DC | PRN

## 2018-12-14 MED ORDER — MISOPROSTOL 200 MCG PO TABS
ORAL_TABLET | ORAL | Status: AC
  Administered 2018-12-14: 800 ug
  Filled 2018-12-14: qty 4

## 2018-12-14 MED ORDER — ACETAMINOPHEN 325 MG PO TABS
650.0000 mg | ORAL_TABLET | ORAL | Status: DC | PRN
  Administered 2018-12-15: 650 mg via ORAL
  Filled 2018-12-14: qty 2

## 2018-12-14 MED ORDER — AMMONIA AROMATIC IN INHA
RESPIRATORY_TRACT | Status: AC
  Filled 2018-12-14: qty 10

## 2018-12-14 MED ORDER — BENZOCAINE-MENTHOL 20-0.5 % EX AERO
1.0000 "application " | INHALATION_SPRAY | CUTANEOUS | Status: DC | PRN
  Administered 2018-12-14: 1 via TOPICAL
  Filled 2018-12-14 (×2): qty 56

## 2018-12-14 MED ORDER — DIBUCAINE (PERIANAL) 1 % EX OINT: 1.0000 "application " | TOPICAL_OINTMENT | CUTANEOUS | Status: DC | PRN

## 2018-12-14 MED ORDER — COCONUT OIL OIL
1.0000 "application " | TOPICAL_OIL | Status: DC | PRN
  Administered 2018-12-14: 1 via TOPICAL
  Filled 2018-12-14 (×2): qty 120

## 2018-12-14 MED ORDER — OXYTOCIN 10 UNIT/ML IJ SOLN
INTRAMUSCULAR | Status: AC
  Filled 2018-12-14: qty 2

## 2018-12-14 MED ORDER — SENNOSIDES-DOCUSATE SODIUM 8.6-50 MG PO TABS
2.0000 | ORAL_TABLET | ORAL | Status: DC
  Filled 2018-12-14: qty 2

## 2018-12-14 MED ORDER — ONDANSETRON HCL 4 MG PO TABS: 4.0000 mg | ORAL_TABLET | ORAL | Status: DC | PRN

## 2018-12-14 MED ORDER — PRENATAL MULTIVITAMIN CH
1.0000 | ORAL_TABLET | Freq: Every day | ORAL | Status: DC
  Administered 2018-12-14: 15:00:00 1 via ORAL
  Filled 2018-12-14: qty 1

## 2018-12-14 NOTE — Discharge Summary (Signed)
Obstetrical Discharge Summary  Patient Name: Meagan White DOB: 10/17/1985 MRN: [id number]  Date of Admission: 12/13/2018 Date of Delivery: 12/14/18 Delivered by: Heloise Ochoaebecca McVey CNM Date of Discharge: 12/15/2018  [facility]  XBJ:[id number]:Patient's last menstrual period was 02/14/2018. EDC Estimated Date of Delivery: 12/22/18 Gestational Age at Delivery: 7022w6d   Antepartum complications:  1. Cholestasis of pregnancy at 38.5wks 2. Migraines  Admitting Diagnosis: cholestasis, 38wks Secondary Diagnosis: SVD, 2nd deg lac  Patient Active Problem List   Diagnosis Date Noted  . Cholestasis during pregnancy in third trimester 12/13/2018  . Rosacea 06/29/2018  . Migraine without aura and without status migrainosus, not intractable 03/26/2018  . Obesity (BMI 30.0-34.9) 03/26/2018  . Acne vulgaris 02/01/2018    Augmentation: AROM, Pitocin and Cytotec Complications: None Intrapartum complications/course: uneventful IOL for cholestasis Date of Delivery: 12/14/18 Delivered By: Heloise Ochoaebecca McVey CNM Delivery Type: spontaneous vaginal delivery Anesthesia: epidural Placenta: spontaneous Laceration: 2nd deg lac Episiotomy: none Newborn Data: Live born female  Birth Weight: 2970g 6lb 8.8oz  APGAR: 8, 9  Newborn Delivery   Birth date/time: 12/14/2018 07:26:00 Delivery type: Vaginal, Spontaneous      Postpartum Procedures: none  Post partum course:  Patient had an uncomplicated postpartum course.  By time of discharge on PPD#1, her pain was controlled on oral pain medications; she had appropriate lochia and was ambulating, voiding without difficulty and tolerating regular diet.  She was deemed stable for discharge to home.      Discharge Physical Exam:  BP 107/65 (BP Location: Left Arm)   Pulse 63   Temp 98.4 F (36.9 C) (Oral)   Resp 20   Ht 5\' 2"  (1.575 m)   Wt 76.7 kg   LMP 02/14/2018 Comment: Patient states it was not a normal cycle  SpO2 99%   Breastfeeding  Unknown   BMI 30.91 kg/m   General: NAD CV: RRR Pulm: CTABL, nl effort ABD: s/nd/nt, fundus firm and below the umbilicus Lochia: moderate DVT Evaluation: LE non-ttp, no evidence of DVT on exam.  Hemoglobin  Date Value Ref Range Status  12/15/2018 10.1 (L) 12.0 - 15.0 g/dL Final  13/08/657810/21/2019 46.912.3 11.1 - 15.9 g/dL Final   HCT  Date Value Ref Range Status  12/15/2018 31.2 (L) 36.0 - 46.0 % Final   Hematocrit  Date Value Ref Range Status  03/26/2018 37.9 34.0 - 46.6 % Final    Disposition: stable, discharge to home. Baby Feeding: breastmilk Baby Disposition: home with mom  Rh Immune globulin given: n/a Rubella vaccine given: n/a Varicella vaccine given: n/a Tdap vaccine given in AP or PP setting: 10/04/2018  Contraception: condoms  Prenatal Labs:  Blood type/Rh  O pos  Antibody screen neg  Rubella Immune  Varicella Immune  RPR NR  HBsAg Neg  HIV NR  GC neg  Chlamydia neg  Genetic screening Negative AFP-Tetra  1 hour GTT  79  GBS negative      Plan:  Meagan RudElizabeth J Clouse was discharged to home in good condition. Follow-up appointment with delivering provider in 6 weeks.  Discharge Medications: Allergies as of 12/15/2018   No Known Allergies     Medication List    TAKE these medications   azelaic acid 20 % cream Commonly known as: AZELEX Apply topically 1 day or 1 dose. After skin is thoroughly washed and patted dry, gently but thoroughly massage a thin film of azelaic acid cream into the affected area twice daily, in the morning and evening.   benzoyl peroxide  5 % external liquid Commonly known as: benzoyl peroxide Apply topically 2 (two) times daily.   clindamycin 1 % external solution Commonly known as: CLEOCIN T Apply topically every morning.   cycloSPORINE 0.05 % ophthalmic emulsion Commonly known as: Restasis Place 1 drop into both eyes 2 (two) times daily.   ferrous sulfate 325 (65 FE) MG tablet Take 325 mg by mouth daily with breakfast.    PNV-DHA+Docusate 27-1.25-300 MG Caps Take by mouth.   promethazine 25 MG tablet Commonly known as: PHENERGAN Take by mouth.   pyridOXINE 25 MG tablet Commonly known as: VITAMIN B-6 Take by mouth.   SUMAtriptan 50 MG tablet Commonly known as: Imitrex Take 1 tablet (50 mg total) by mouth every 2 (two) hours as needed for migraine. May repeat in 2 hours if headache persists or recurs.       Follow-up Information    McVey, Murray Hodgkins, CNM. Schedule an appointment as soon as possible for a visit in 6 week(s).   Specialty: Obstetrics and Gynecology Why: For routine postpartum visit Contact information: [address] [place] [address] [phone]           Signed: ----- Larey Days, MD, [place] Attending Obstetrician and Gynecologist [facility], Department of [facility]

## 2018-12-14 NOTE — Progress Notes (Signed)
Labor Progress Note  Meagan White is a 33 y.o. [id number] at [redacted]w[redacted]d by ultrasound admitted for induction of labor due to Cholestasis of pregnancy.  Subjective: still having some itching, feeling contractions, mild.   Objective: BP 100/66 (BP Location: Left Arm)   Pulse 82   Temp 98.3 F (36.8 C) (Oral)   Resp 16   Ht 5\' 2"  (1.575 m)   Wt 76.7 kg   LMP 02/14/2018 Comment: Patient states it was not a normal cycle  BMI 30.91 kg/m  Notable VS details: reviewed  Fetal Assessment: FHT:  FHR: 135 bpm, variability: moderate,  accelerations:  Present,  decelerations:  Absent Category/reactivity:  Category I UC:   regular, every 2-4 minutes, Pitocin at 42mu/min SVE:   4/80/-1, soft/posterior Membrane status: AROM performed at 0617 Amniotic color: clear  Labs: Lab Results  Component Value Date   WBC 7.9 12/13/2018   HGB 11.4 (L) 12/13/2018   HCT 33.4 (L) 12/13/2018   MCV 90.5 12/13/2018   PLT 151 12/13/2018    Assessment / Plan: Induction of labor due to cholestasis,  progressing well on pitocin  Labor: Progressing normally, s/p AROM and on pitocin.  Preeclampsia:  no e/o pre-e Fetal Wellbeing:  Category I Pain Control:  Labor support without medications I/D:  n/a Anticipated MOD:  NSVD  Murray Hodgkins McVey, CNM 12/14/2018, 6:20 AM

## 2018-12-14 NOTE — Lactation Note (Addendum)
This note was copied from a baby's chart. Lactation Consultation Note  Patient Name: Meagan White [id number] Date: 12/14/2018   Mom called for assistance with breast feeding.  Mom reports Meagan White latched in [place] and breast fed for 15 to 20 minutes right after delivery, but have not been able to get him to wake up to feed well since then.  Demonstrated gentle waking techniques, but as soon as we got him latched to the breast he would go back to sleep refusing to suck.  Demonstrated hand expression and spoon fed 3 ml which enticed him to start rooting.  Meagan White latched and took a few sucks.  He was on and off the breast a few more times to take a few more sucks.  Left him skin to skin with mom at the breast.  Reviewed feeding cues and encouraged mom to put him to the breast any time he demonstrated feeding cues.  Reviewed newborn's stomach size, supply and demand, normal course of lactation and routine newborn feeding patterns.  Lactation name and number written on white board and encouraged to call with any questions, concerns or assistance.    Maternal Data    Feeding    LATCH Score                   Interventions    Lactation Tools Discussed/Used     Consult Status      Meagan White 12/14/2018, 6:08 PM

## 2018-12-14 NOTE — Discharge Instructions (Signed)

## 2018-12-15 LAB — RPR: RPR Ser Ql: NONREACTIVE

## 2018-12-15 LAB — CBC
HCT: 31.2 % — ABNORMAL LOW (ref 36.0–46.0)
Hemoglobin: 10.1 g/dL — ABNORMAL LOW (ref 12.0–15.0)
MCH: 30.5 pg (ref 26.0–34.0)
MCHC: 32.4 g/dL (ref 30.0–36.0)
MCV: 94.3 fL (ref 80.0–100.0)
Platelets: 141 10*3/uL — ABNORMAL LOW (ref 150–400)
RBC: 3.31 MIL/uL — ABNORMAL LOW (ref 3.87–5.11)
RDW: 13.2 % (ref 11.5–15.5)
WBC: 8.8 10*3/uL (ref 4.0–10.5)
nRBC: 0 % (ref 0.0–0.2)

## 2018-12-15 LAB — BILE ACIDS, TOTAL: Bile Acids Total: 15.1 umol/L — ABNORMAL HIGH (ref 0.0–10.0)

## 2018-12-15 NOTE — Progress Notes (Signed)
Discharge instructions given. Patient verbalizes understanding of teaching. Patient discharged home via wheelchair at 1215. 

## 2018-12-15 NOTE — Lactation Note (Signed)
This note was copied from a baby's chart. Lactation Consultation Note  Patient Name: Meagan White Today's Date: 12/15/2018   Being d/c'd home soon, states breastfeeding going better today, states she has no questions or concerns at present  Maternal Data    Feeding Feeding Type: Breast Fed  [employer] Score                   Interventions    Lactation Tools Discussed/Used     Consult Status      Ferol Luz 12/15/2018, 12:34 PM

## 2018-12-22 ENCOUNTER — Inpatient Hospital Stay
Admission: RE | Admit: 2018-12-22 | Payer: No Typology Code available for payment source | Source: Home / Self Care | Admitting: *Deleted

## 2019-01-22 ENCOUNTER — Other Ambulatory Visit: Payer: Self-pay

## 2019-01-22 DIAGNOSIS — Z20822 Contact with and (suspected) exposure to covid-19: Secondary | ICD-10-CM

## 2019-01-23 LAB — NOVEL CORONAVIRUS, NAA: SARS-CoV-2, NAA: DETECTED — AB

## 2019-03-28 ENCOUNTER — Encounter: Payer: Self-pay | Admitting: Family Medicine

## 2019-03-28 ENCOUNTER — Ambulatory Visit (INDEPENDENT_AMBULATORY_CARE_PROVIDER_SITE_OTHER): Payer: No Typology Code available for payment source | Admitting: Family Medicine

## 2019-03-28 ENCOUNTER — Other Ambulatory Visit: Payer: Self-pay

## 2019-03-28 VITALS — BP 112/77 | HR 66 | Temp 98.1°F | Ht 60.79 in | Wt 155.2 lb

## 2019-03-28 DIAGNOSIS — G43009 Migraine without aura, not intractable, without status migrainosus: Secondary | ICD-10-CM | POA: Diagnosis not present

## 2019-03-28 DIAGNOSIS — Z0000 Encounter for general adult medical examination without abnormal findings: Secondary | ICD-10-CM

## 2019-03-28 DIAGNOSIS — L700 Acne vulgaris: Secondary | ICD-10-CM

## 2019-03-28 LAB — UA/M W/RFLX CULTURE, ROUTINE
Bilirubin, UA: NEGATIVE
Glucose, UA: NEGATIVE
Ketones, UA: NEGATIVE
Leukocytes,UA: NEGATIVE
Nitrite, UA: NEGATIVE
Protein,UA: NEGATIVE
RBC, UA: NEGATIVE
Specific Gravity, UA: 1.025 (ref 1.005–1.030)
Urobilinogen, Ur: 0.2 mg/dL (ref 0.2–1.0)
pH, UA: 6 (ref 5.0–7.5)

## 2019-03-28 MED ORDER — BENZOYL PEROXIDE WASH 5 % EX LIQD: Freq: Two times a day (BID) | CUTANEOUS | 12 refills | Status: DC

## 2019-03-28 NOTE — Progress Notes (Signed)
BP 112/77   Pulse 66   Temp 98.1 F (36.7 C)   Ht 5' 0.79" (1.544 m)   Wt 155 lb 4 oz (70.4 kg)   SpO2 98%   BMI 29.54 kg/m    Subjective:    Patient ID: Meagan White, female    DOB: [birth year], 33 y.o.   MRN: [id number]  HPI: Meagan White is a 33 y.o. female presenting on 03/28/2019 for comprehensive medical examination. Current medical complaints include:see below  Switched from imitrex to fioricet during her recent pregnancy by GYN, states it seems to work better than the imitrex for her. Did well on the imitrex previously.   Has been using benzoyl peroxide for her acne for over a year now, happy with how it's working for her. No side effects noted.   She currently lives with: Menopausal Symptoms: no  Depression Screen done today and results listed below:  Depression screen [facility] 2/9 03/28/2019 02/01/2018  Decreased Interest 0 0  Down, Depressed, Hopeless 0 0  PHQ - 2 Score 0 0  Altered sleeping - 0  Tired, decreased energy - 1  Change in appetite - 0  Feeling bad or failure about yourself  - 0  Trouble concentrating - 0  Moving slowly or fidgety/restless - 0  Suicidal thoughts - 0  PHQ-9 Score - 1    The patient does not have a history of falls. I did complete a risk assessment for falls. A plan of care for falls was documented.   Past Medical History:  Past Medical History:  Diagnosis Date  . Anemia   . Skin disease     Surgical History:  Past Surgical History:  Procedure Laterality Date  . VAGINA SURGERY      Medications:  Current Outpatient Medications on File Prior to Visit  Medication Sig  . butalbital-acetaminophen-caffeine (FIORICET WITH CODEINE) 50-325-40-30 MG capsule Take 1 capsule by mouth every 4 (four) hours as needed for headache.  . Multiple Vitamins-Minerals (HAIR SKIN AND NAILS FORMULA) TABS   . azelaic acid (AZELEX) 20 % cream Apply topically 1 day or 1 dose. After skin is thoroughly washed and patted dry, gently but  thoroughly massage a thin film of azelaic acid cream into the affected area twice daily, in the morning and evening.  . clindamycin (CLEOCIN T) 1 % external solution Apply topically every morning.  . Prenat-FeFum-DSS-FA-DHA w/o A (PNV-DHA+DOCUSATE) 27-1.25-300 MG CAPS Take by mouth.  . promethazine (PHENERGAN) 25 MG tablet Take by mouth.   No current facility-administered medications on file prior to visit.     Allergies:  No Known Allergies  Social History:  Social History   Socioeconomic History  . Marital status: Single    Spouse name: Not on file  . Number of children: Not on file  . Years of education: Not on file  . Highest education level: Not on file  Occupational History  . Not on file  Social Needs  . Financial resource strain: Not on file  . Food insecurity    Worry: Not on file    Inability: Not on file  . Transportation needs    Medical: Not on file    Non-medical: Not on file  Tobacco Use  . Smoking status: Never Smoker  . Smokeless tobacco: Never Used  Substance and Sexual Activity  . Alcohol use: Yes    Comment: Soxially  . Drug use: Never  . Sexual activity: Yes    Birth control/protection: Condom  Lifestyle  .  Physical activity    Days per week: Not on file    Minutes per session: Not on file  . Stress: Not on file  Relationships  . Social Musician on phone: Not on file    Gets together: Not on file    Attends religious service: Not on file    Active member of club or organization: Not on file    Attends meetings of clubs or organizations: Not on file    Relationship status: Not on file  . Intimate partner violence    Fear of current or ex partner: Not on file    Emotionally abused: Not on file    Physically abused: Not on file    Forced sexual activity: Not on file  Other Topics Concern  . Not on file  Social History Narrative  . Not on file   Social History   Tobacco Use  Smoking Status Never Smoker  Smokeless Tobacco  Never Used   Social History   Substance and Sexual Activity  Alcohol Use Yes   Comment: Soxially    Family History:  Family History  Problem Relation Age of Onset  . Hypertension Father     Past medical history, surgical history, medications, allergies, family history and social history reviewed with patient today and changes made to appropriate areas of the chart.   Review of Systems - General ROS: negative Psychological ROS: negative Ophthalmic ROS: negative ENT ROS: negative Allergy and Immunology ROS: negative Hematological and Lymphatic ROS: negative Endocrine ROS: negative Breast ROS: negative for breast lumps Respiratory ROS: no cough, shortness of breath, or wheezing Cardiovascular ROS: no chest pain or dyspnea on exertion Gastrointestinal ROS: no abdominal pain, change in bowel habits, or black or bloody stools Genito-Urinary ROS: no dysuria, trouble voiding, or hematuria Musculoskeletal ROS: negative Neurological ROS: no TIA or stroke symptoms Dermatological ROS: negative All other ROS negative except what is listed above and in the HPI.      Objective:    BP 112/77   Pulse 66   Temp 98.1 F (36.7 C)   Ht 5' 0.79" (1.544 m)   Wt 155 lb 4 oz (70.4 kg)   SpO2 98%   BMI 29.54 kg/m   Wt Readings from Last 3 Encounters:  03/28/19 155 lb 4 oz (70.4 kg)  12/13/18 169 lb (76.7 kg)  06/25/18 163 lb 2 oz (74 kg)    Physical Exam Vitals signs and nursing note reviewed.  Constitutional:      General: She is not in acute distress.    Appearance: She is well-developed.  HENT:     Head: Atraumatic.     Right Ear: External ear normal.     Left Ear: External ear normal.     Nose: Nose normal.     Mouth/Throat:     Pharynx: No oropharyngeal exudate.  Eyes:     General: No scleral icterus.    Conjunctiva/sclera: Conjunctivae normal.     Pupils: Pupils are equal, round, and reactive to light.  Neck:     Musculoskeletal: Normal range of motion and neck supple.      Thyroid: No thyromegaly.  Cardiovascular:     Rate and Rhythm: Normal rate and regular rhythm.     Heart sounds: Normal heart sounds.  Pulmonary:     Effort: Pulmonary effort is normal. No respiratory distress.     Breath sounds: Normal breath sounds.  Chest:     Comments: Breast exam done through  GYN Abdominal:     General: Bowel sounds are normal.     Palpations: Abdomen is soft. There is no mass.     Tenderness: There is no abdominal tenderness.  Genitourinary:    Comments: GU exam done through GYN Musculoskeletal: Normal range of motion.        General: No tenderness.  Lymphadenopathy:     Cervical: No cervical adenopathy.  Skin:    General: Skin is warm and dry.     Findings: No rash.  Neurological:     Mental Status: She is alert and oriented to person, place, and time.     Cranial Nerves: No cranial nerve deficit.  Psychiatric:        Behavior: Behavior normal.     Results for orders placed or performed in visit on 03/28/19  CBC with Differential/Platelet  Result Value Ref Range   WBC 4.7 3.4 - 10.8 x10E3/uL   RBC 4.41 3.77 - 5.28 x10E6/uL   Hemoglobin 12.8 11.1 - 15.9 g/dL   Hematocrit 16.138.3 09.634.0 - 46.6 %   MCV 87 79 - 97 fL   MCH 29.0 26.6 - 33.0 pg   MCHC 33.4 31.5 - 35.7 g/dL   RDW 04.512.2 40.911.7 - 81.115.4 %   Platelets 209 150 - 450 x10E3/uL   Neutrophils 59 Not Estab. %   Lymphs 28 Not Estab. %   Monocytes 9 Not Estab. %   Eos 3 Not Estab. %   Basos 1 Not Estab. %   Neutrophils Absolute 2.8 1.4 - 7.0 x10E3/uL   Lymphocytes Absolute 1.3 0.7 - 3.1 x10E3/uL   Monocytes Absolute 0.4 0.1 - 0.9 x10E3/uL   EOS (ABSOLUTE) 0.1 0.0 - 0.4 x10E3/uL   Basophils Absolute 0.0 0.0 - 0.2 x10E3/uL   Immature Granulocytes 0 Not Estab. %   Immature Grans (Abs) 0.0 0.0 - 0.1 x10E3/uL  Comprehensive metabolic panel  Result Value Ref Range   Glucose 91 65 - 99 mg/dL   BUN 15 6 - 20 mg/dL   Creatinine, Ser 9.140.66 0.57 - 1.00 mg/dL   GFR calc non Af Amer 118 >59 mL/min/1.73    GFR calc Af Amer 136 >59 mL/min/1.73   BUN/Creatinine Ratio 23 9 - 23   Sodium 140 134 - 144 mmol/L   Potassium 4.3 3.5 - 5.2 mmol/L   Chloride 102 96 - 106 mmol/L   CO2 26 20 - 29 mmol/L   Calcium 9.4 8.7 - 10.2 mg/dL   Total Protein 7.3 6.0 - 8.5 g/dL   Albumin 4.6 3.8 - 4.8 g/dL   Globulin, Total 2.7 1.5 - 4.5 g/dL   Albumin/Globulin Ratio 1.7 1.2 - 2.2   Bilirubin Total 0.4 0.0 - 1.2 mg/dL   Alkaline Phosphatase 132 (H) 39 - 117 IU/L   AST 28 0 - 40 IU/L   ALT 54 (H) 0 - 32 IU/L  Lipid Panel w/o Chol/HDL Ratio  Result Value Ref Range   Cholesterol, Total 177 100 - 199 mg/dL   Triglycerides 782101 0 - 149 mg/dL   HDL 53 >95>39 mg/dL   VLDL Cholesterol Cal 18 5 - 40 mg/dL   LDL Chol Calc (NIH) 621106 (H) 0 - 99 mg/dL  TSH  Result Value Ref Range   TSH 1.670 0.450 - 4.500 uIU/mL  UA/M w/rflx Culture, Routine   Specimen: Urine   URINE  Result Value Ref Range   Specific Gravity, UA 1.025 1.005 - 1.030   pH, UA 6.0 5.0 - 7.5   Color,  UA Yellow Yellow   Appearance Ur Clear Clear   Leukocytes,UA Negative Negative   Protein,UA Negative Negative/Trace   Glucose, UA Negative Negative   Ketones, UA Negative Negative   RBC, UA Negative Negative   Bilirubin, UA Negative Negative   Urobilinogen, Ur 0.2 0.2 - 1.0 mg/dL   Nitrite, UA Negative Negative      Assessment & Plan:   Problem List Items Addressed This Visit      Cardiovascular and Mediastinum   Migraine without aura and without status migrainosus, not intractable    Discussed safety risks and rebound headaches associated with fioricet, will continue prn imitrex      Relevant Medications   butalbital-acetaminophen-caffeine (FIORICET WITH CODEINE) 50-325-40-30 MG capsule     Musculoskeletal and Integument   Acne vulgaris    Under good control with benzoyl wash, continue current regimen      Relevant Medications   benzoyl peroxide (BENZOYL PEROXIDE) 5 % external liquid    Other Visit Diagnoses    Annual physical exam     -  Primary   Relevant Orders   CBC with Differential/Platelet (Completed)   Comprehensive metabolic panel (Completed)   Lipid Panel w/o Chol/HDL Ratio (Completed)   TSH (Completed)   UA/M w/rflx Culture, Routine (Completed)       Follow up plan: Return in about 1 year (around 03/27/2020) for CPE.   LABORATORY TESTING:  - Pap smear: up to date  IMMUNIZATIONS:   - Tdap: Tetanus vaccination status reviewed: last tetanus booster within 10 years. - Influenza: Up to date  PATIENT COUNSELING:   Advised to take 1 mg of folate supplement per day if capable of pregnancy.   Sexuality: Discussed sexually transmitted diseases, partner selection, use of condoms, avoidance of unintended pregnancy  and contraceptive alternatives.   Advised to avoid cigarette smoking.  I discussed with the patient that most people either abstain from alcohol or drink within safe limits (<=14/week and <=4 drinks/occasion for males, <=7/weeks and <= 3 drinks/occasion for females) and that the risk for alcohol disorders and other health effects rises proportionally with the number of drinks per week and how often a drinker exceeds daily limits.  Discussed cessation/primary prevention of drug use and availability of treatment for abuse.   Diet: Encouraged to adjust caloric intake to maintain  or achieve ideal body weight, to reduce intake of dietary saturated fat and total fat, to limit sodium intake by avoiding high sodium foods and not adding table salt, and to maintain adequate dietary potassium and calcium preferably from fresh fruits, vegetables, and low-fat dairy products.    stressed the importance of regular exercise  Injury prevention: Discussed safety belts, safety helmets, smoke detector, smoking near bedding or upholstery.   Dental health: Discussed importance of regular tooth brushing, flossing, and dental visits.    NEXT PREVENTATIVE PHYSICAL DUE IN 1 YEAR. Return in about 1 year (around  03/27/2020) for CPE.

## 2019-03-29 LAB — CBC WITH DIFFERENTIAL/PLATELET
Basophils Absolute: 0 10*3/uL (ref 0.0–0.2)
Basos: 1 %
EOS (ABSOLUTE): 0.1 10*3/uL (ref 0.0–0.4)
Eos: 3 %
Hematocrit: 38.3 % (ref 34.0–46.6)
Hemoglobin: 12.8 g/dL (ref 11.1–15.9)
Immature Grans (Abs): 0 10*3/uL (ref 0.0–0.1)
Immature Granulocytes: 0 %
Lymphocytes Absolute: 1.3 10*3/uL (ref 0.7–3.1)
Lymphs: 28 %
MCH: 29 pg (ref 26.6–33.0)
MCHC: 33.4 g/dL (ref 31.5–35.7)
MCV: 87 fL (ref 79–97)
Monocytes Absolute: 0.4 10*3/uL (ref 0.1–0.9)
Monocytes: 9 %
Neutrophils Absolute: 2.8 10*3/uL (ref 1.4–7.0)
Neutrophils: 59 %
Platelets: 209 10*3/uL (ref 150–450)
RBC: 4.41 x10E6/uL (ref 3.77–5.28)
RDW: 12.2 % (ref 11.7–15.4)
WBC: 4.7 10*3/uL (ref 3.4–10.8)

## 2019-03-29 LAB — COMPREHENSIVE METABOLIC PANEL
ALT: 54 IU/L — ABNORMAL HIGH (ref 0–32)
AST: 28 IU/L (ref 0–40)
Albumin/Globulin Ratio: 1.7 (ref 1.2–2.2)
Albumin: 4.6 g/dL (ref 3.8–4.8)
Alkaline Phosphatase: 132 IU/L — ABNORMAL HIGH (ref 39–117)
BUN/Creatinine Ratio: 23 (ref 9–23)
BUN: 15 mg/dL (ref 6–20)
Bilirubin Total: 0.4 mg/dL (ref 0.0–1.2)
CO2: 26 mmol/L (ref 20–29)
Calcium: 9.4 mg/dL (ref 8.7–10.2)
Chloride: 102 mmol/L (ref 96–106)
Creatinine, Ser: 0.66 mg/dL (ref 0.57–1.00)
GFR calc Af Amer: 136 mL/min/{1.73_m2} (ref >59)
GFR calc non Af Amer: 118 mL/min/{1.73_m2} (ref >59)
Globulin, Total: 2.7 g/dL (ref 1.5–4.5)
Glucose: 91 mg/dL (ref 65–99)
Potassium: 4.3 mmol/L (ref 3.5–5.2)
Sodium: 140 mmol/L (ref 134–144)
Total Protein: 7.3 g/dL (ref 6.0–8.5)

## 2019-03-29 LAB — LIPID PANEL W/O CHOL/HDL RATIO
Cholesterol, Total: 177 mg/dL (ref 100–199)
HDL: 53 mg/dL (ref >39)
LDL Chol Calc (NIH): 106 mg/dL — ABNORMAL HIGH (ref 0–99)
Triglycerides: 101 mg/dL (ref 0–149)
VLDL Cholesterol Cal: 18 mg/dL (ref 5–40)

## 2019-03-29 LAB — TSH: TSH: 1.67 u[IU]/mL (ref 0.450–4.500)

## 2019-04-01 NOTE — Assessment & Plan Note (Signed)
Discussed safety risks and rebound headaches associated with fioricet, will continue prn imitrex

## 2019-04-01 NOTE — Assessment & Plan Note (Signed)
Under good control with benzoyl wash, continue current regimen

## 2020-04-02 ENCOUNTER — Encounter: Payer: No Typology Code available for payment source | Admitting: Unknown Physician Specialty

## 2020-04-02 ENCOUNTER — Encounter: Payer: No Typology Code available for payment source | Admitting: Family Medicine

## 2020-04-23 ENCOUNTER — Encounter: Payer: Self-pay | Admitting: Unknown Physician Specialty

## 2020-04-23 ENCOUNTER — Other Ambulatory Visit: Payer: Self-pay | Admitting: Unknown Physician Specialty

## 2020-04-23 ENCOUNTER — Other Ambulatory Visit: Payer: Self-pay

## 2020-04-23 ENCOUNTER — Ambulatory Visit (INDEPENDENT_AMBULATORY_CARE_PROVIDER_SITE_OTHER): Payer: No Typology Code available for payment source | Admitting: Unknown Physician Specialty

## 2020-04-23 ENCOUNTER — Other Ambulatory Visit (HOSPITAL_COMMUNITY)
Admission: RE | Admit: 2020-04-23 | Discharge: 2020-04-23 | Disposition: A | Discharge status: Home / Self Care | Payer: No Typology Code available for payment source | Source: Ambulatory Visit | Attending: Family Medicine | Admitting: Family Medicine

## 2020-04-23 VITALS — BP 91/58 | HR 71 | Temp 98.1°F | Ht 61.0 in | Wt 153.0 lb

## 2020-04-23 DIAGNOSIS — G43009 Migraine without aura, not intractable, without status migrainosus: Secondary | ICD-10-CM | POA: Diagnosis not present

## 2020-04-23 DIAGNOSIS — Z0000 Encounter for general adult medical examination without abnormal findings: Secondary | ICD-10-CM

## 2020-04-23 DIAGNOSIS — H04123 Dry eye syndrome of bilateral lacrimal glands: Secondary | ICD-10-CM

## 2020-04-23 MED ORDER — CYCLOSPORINE 0.05 % OP EMUL
1.0000 [drp] | Freq: Two times a day (BID) | OPHTHALMIC | 12 refills | Status: DC
Start: 2020-04-23 — End: 2020-04-23

## 2020-04-23 NOTE — Progress Notes (Signed)
BP (!) 91/58   Pulse 71   Temp 98.1 F (36.7 C) (Oral)   Ht 5\' 1"  (1.549 m)   Wt 153 lb (69.4 kg)   LMP 04/15/2020 (Approximate)   SpO2 98%   Breastfeeding No   BMI 28.91 kg/m    Subjective:    Patient ID: [id number], female    DOB: February 29, 1986, 34 y.o.   MRN: [id number]  HPI: Meagan White is a 34 y.o. female  Chief Complaint  Patient presents with  . Annual Exam    pt states she needs a referral to [facility] for [facility] exam   Migraines Gets them about once a month.  Typically takes Ibuprofen.  Imitrex makes it worse before better but still uses it on occasion.  Has not missed work yet.    Depression screen [facility] 2/9 04/23/2020 03/28/2019 02/01/2018  Decreased Interest 0 0 0  Down, Depressed, Hopeless 0 0 0  PHQ - 2 Score 0 0 0  Altered sleeping - - 0  Tired, decreased energy - - 1  Change in appetite - - 0  Feeling bad or failure about yourself  - - 0  Trouble concentrating - - 0  Moving slowly or fidgety/restless - - 0  Suicidal thoughts - - 0  PHQ-9 Score - - 1     Family History  Problem Relation Age of Onset  . Hypertension Father    Past Medical History:  Diagnosis Date  . Anemia   . Skin disease    Past Surgical History:  Procedure Laterality Date  . VAGINA SURGERY      Social History   Socioeconomic History  . Marital status: Single    Spouse name: Not on file  . Number of children: Not on file  . Years of education: Not on file  . Highest education level: Not on file  Occupational History  . Not on file  Tobacco Use  . Smoking status: Never Smoker  . Smokeless tobacco: Never Used  Vaping Use  . Vaping Use: Never used  Substance and Sexual Activity  . Alcohol use: Yes    Comment: Socially  . Drug use: Never  . Sexual activity: Yes    Birth control/protection: Condom  Other Topics Concern  . Not on file  Social History Narrative  . Not on file   Social Determinants of Health   Financial Resource Strain:    . Difficulty of Paying Living Expenses: Not on file  Food Insecurity:   . Worried About 02/03/2018 in the Last Year: Not on file  . Ran Out of Food in the Last Year: Not on file  Transportation Needs:   . Lack of Transportation (Medical): Not on file  . Lack of Transportation (Non-Medical): Not on file  Physical Activity:   . Days of Exercise per Week: Not on file  . Minutes of Exercise per Session: Not on file  Stress:   . Feeling of Stress : Not on file  Social Connections:   . Frequency of Communication with Friends and Family: Not on file  . Frequency of Social Gatherings with Friends and Family: Not on file  . Attends Religious Services: Not on file  . Active Member of Clubs or Organizations: Not on file  . Attends Programme researcher, broadcasting/film/video Meetings: Not on file  . Marital Status: Not on file  Intimate Partner Violence:   . Fear of Current or Ex-Partner: Not on file  .  Emotionally Abused: Not on file  . Physically Abused: Not on file  . Sexually Abused: Not on file    Relevant past medical, surgical, family and social history reviewed and updated as indicated. Interim medical history since our last visit reviewed. Allergies and medications reviewed and updated.  Review of Systems  Constitutional: Negative.   HENT: Negative.   Eyes: Negative.   Respiratory: Negative.   Cardiovascular: Negative.   Gastrointestinal: Negative.   Endocrine: Negative.   Genitourinary: Negative.   Musculoskeletal: Negative.   Skin: Negative.   Allergic/Immunologic: Negative.   Neurological: Negative.   Hematological: Negative.   Psychiatric/Behavioral: Negative.     Per HPI unless specifically indicated above     Objective:    BP (!) 91/58   Pulse 71   Temp 98.1 F (36.7 C) (Oral)   Ht 5\' 1"  (1.549 m)   Wt 153 lb (69.4 kg)   LMP 04/15/2020 (Approximate)   SpO2 98%   Breastfeeding No   BMI 28.91 kg/m   Wt Readings from Last 3 Encounters:  04/23/20 153 lb (69.4 kg)    03/28/19 155 lb 4 oz (70.4 kg)  12/13/18 169 lb (76.7 kg)    Physical Exam Constitutional:      Appearance: She is well-developed.  HENT:     Head: Normocephalic and atraumatic.  Eyes:     General: No scleral icterus.       Right eye: No discharge.        Left eye: No discharge.     Pupils: Pupils are equal, round, and reactive to light.  Neck:     Thyroid: No thyromegaly.     Vascular: No carotid bruit.  Cardiovascular:     Rate and Rhythm: Normal rate and regular rhythm.     Heart sounds: Normal heart sounds. No murmur heard.  No friction rub. No gallop.   Pulmonary:     Effort: Pulmonary effort is normal. No respiratory distress.     Breath sounds: Normal breath sounds. No wheezing or rales.  Abdominal:     General: Bowel sounds are normal.     Palpations: Abdomen is soft.     Tenderness: There is no abdominal tenderness. There is no rebound.  Genitourinary:    Vagina: Normal.     Cervix: No cervical motion tenderness, discharge or friability.     Adnexa:        Right: No mass, tenderness or fullness.         Left: No mass, tenderness or fullness.    Musculoskeletal:        General: Normal range of motion.     Cervical back: Normal range of motion and neck supple.  Lymphadenopathy:     Cervical: No cervical adenopathy.  Skin:    General: Skin is warm and dry.     Findings: No rash.  Neurological:     Mental Status: She is alert and oriented to person, place, and time.  Psychiatric:        Speech: Speech normal.        Behavior: Behavior normal.        Thought Content: Thought content normal.        Judgment: Judgment normal.     Results for orders placed or performed in visit on 03/28/19  CBC with Differential/Platelet  Result Value Ref Range   WBC 4.7 3.4 - 10.8 x10E3/uL   RBC 4.41 3.77 - 5.28 x10E6/uL   Hemoglobin 12.8 11.1 - 15.9 g/dL  Hematocrit 38.3 34.0 - 46.6 %   MCV 87 79 - 97 fL   MCH 29.0 26.6 - 33.0 pg   MCHC 33.4 31 - 35 g/dL   RDW 01.7  51.0 - 25.8 %   Platelets 209 150 - 450 x10E3/uL   Neutrophils 59 Not Estab. %   Lymphs 28 Not Estab. %   Monocytes 9 Not Estab. %   Eos 3 Not Estab. %   Basos 1 Not Estab. %   Neutrophils Absolute 2.8 1.40 - 7.00 x10E3/uL   Lymphocytes Absolute 1.3 0 - 3 x10E3/uL   Monocytes Absolute 0.4 0 - 0 x10E3/uL   EOS (ABSOLUTE) 0.1 0.0 - 0.4 x10E3/uL   Basophils Absolute 0.0 0 - 0 x10E3/uL   Immature Granulocytes 0 Not Estab. %   Immature Grans (Abs) 0.0 0.0 - 0.1 x10E3/uL  Comprehensive metabolic panel  Result Value Ref Range   Glucose 91 65 - 99 mg/dL   BUN 15 6 - 20 mg/dL   Creatinine, Ser 5.27 0.57 - 1.00 mg/dL   GFR calc non Af Amer 118 >59 mL/min/1.73   GFR calc Af Amer 136 >59 mL/min/1.73   BUN/Creatinine Ratio 23 9 - 23   Sodium 140 134 - 144 mmol/L   Potassium 4.3 3.5 - 5.2 mmol/L   Chloride 102 96 - 106 mmol/L   CO2 26 20 - 29 mmol/L   Calcium 9.4 8.7 - 10.2 mg/dL   Total Protein 7.3 6.0 - 8.5 g/dL   Albumin 4.6 3.8 - 4.8 g/dL   Globulin, Total 2.7 1.5 - 4.5 g/dL   Albumin/Globulin Ratio 1.7 1.2 - 2.2   Bilirubin Total 0.4 0.0 - 1.2 mg/dL   Alkaline Phosphatase 132 (H) 39 - 117 IU/L   AST 28 0 - 40 IU/L   ALT 54 (H) 0 - 32 IU/L  Lipid Panel w/o Chol/HDL Ratio  Result Value Ref Range   Cholesterol, Total 177 100 - 199 mg/dL   Triglycerides 782 0 - 149 mg/dL   HDL 53 >42 mg/dL   VLDL Cholesterol Cal 18 5 - 40 mg/dL   LDL Chol Calc (NIH) 353 (H) 0 - 99 mg/dL  TSH  Result Value Ref Range   TSH 1.670 0.450 - 4.500 uIU/mL  UA/M w/rflx Culture, Routine   Specimen: Urine   URINE  Result Value Ref Range   Specific Gravity, UA 1.025 1.005 - 1.030   pH, UA 6.0 5.0 - 7.5   Color, UA Yellow Yellow   Appearance Ur Clear Clear   Leukocytes,UA Negative Negative   Protein,UA Negative Negative/Trace   Glucose, UA Negative Negative   Ketones, UA Negative Negative   RBC, UA Negative Negative   Bilirubin, UA Negative Negative   Urobilinogen, Ur 0.2 0.2 - 1.0 mg/dL   Nitrite,  UA Negative Negative      Assessment & Plan:   Problem List Items Addressed This Visit      Unprioritized   Migraine without aura and without status migrainosus, not intractable    These are stable on Ibuprofen and occasional Immitrex       Other Visit Diagnoses    Dry eyes    -  Primary   Relevant Orders   Ambulatory referral to Optometry   ANA w/Reflex   Routine general medical examination at a health care facility       Relevant Orders   CBC with Differential/Platelet   Comprehensive metabolic panel   TSH   Lipid Panel w/o Chol/HDL Ratio  Cytology - PAP       Follow up plan: Return in about 1 year (around 04/23/2021).

## 2020-04-23 NOTE — Assessment & Plan Note (Signed)
These are stable on Ibuprofen and occasional Immitrex

## 2020-04-24 LAB — COMPREHENSIVE METABOLIC PANEL
ALT: 27 IU/L (ref 0–32)
AST: 16 IU/L (ref 0–40)
Albumin/Globulin Ratio: 1.5 (ref 1.2–2.2)
Albumin: 4.2 g/dL (ref 3.8–4.8)
Alkaline Phosphatase: 108 IU/L (ref 44–121)
BUN/Creatinine Ratio: 23 (ref 9–23)
BUN: 15 mg/dL (ref 6–20)
Bilirubin Total: 0.5 mg/dL (ref 0.0–1.2)
CO2: 25 mmol/L (ref 20–29)
Calcium: 9 mg/dL (ref 8.7–10.2)
Chloride: 102 mmol/L (ref 96–106)
Creatinine, Ser: 0.64 mg/dL (ref 0.57–1.00)
GFR calc Af Amer: 137 mL/min/{1.73_m2} (ref >59)
GFR calc non Af Amer: 118 mL/min/{1.73_m2} (ref >59)
Globulin, Total: 2.8 g/dL (ref 1.5–4.5)
Glucose: 93 mg/dL (ref 65–99)
Potassium: 4.1 mmol/L (ref 3.5–5.2)
Sodium: 139 mmol/L (ref 134–144)
Total Protein: 7 g/dL (ref 6.0–8.5)

## 2020-04-24 LAB — CBC WITH DIFFERENTIAL/PLATELET
Basophils Absolute: 0 10*3/uL (ref 0.0–0.2)
Basos: 1 %
EOS (ABSOLUTE): 0.2 10*3/uL (ref 0.0–0.4)
Eos: 3 %
Hematocrit: 39.6 % (ref 34.0–46.6)
Hemoglobin: 12.7 g/dL (ref 11.1–15.9)
Immature Grans (Abs): 0 10*3/uL (ref 0.0–0.1)
Immature Granulocytes: 0 %
Lymphocytes Absolute: 1.6 10*3/uL (ref 0.7–3.1)
Lymphs: 28 %
MCH: 28.1 pg (ref 26.6–33.0)
MCHC: 32.1 g/dL (ref 31.5–35.7)
MCV: 88 fL (ref 79–97)
Monocytes Absolute: 0.4 10*3/uL (ref 0.1–0.9)
Monocytes: 7 %
Neutrophils Absolute: 3.6 10*3/uL (ref 1.4–7.0)
Neutrophils: 61 %
Platelets: 232 10*3/uL (ref 150–450)
RBC: 4.52 x10E6/uL (ref 3.77–5.28)
RDW: 12.9 % (ref 11.7–15.4)
WBC: 5.9 10*3/uL (ref 3.4–10.8)

## 2020-04-24 LAB — TSH: TSH: 2.66 u[IU]/mL (ref 0.450–4.500)

## 2020-04-24 LAB — LIPID PANEL W/O CHOL/HDL RATIO
Cholesterol, Total: 153 mg/dL (ref 100–199)
HDL: 41 mg/dL (ref >39)
LDL Chol Calc (NIH): 89 mg/dL (ref 0–99)
Triglycerides: 127 mg/dL (ref 0–149)
VLDL Cholesterol Cal: 23 mg/dL (ref 5–40)

## 2020-04-24 LAB — ANA W/REFLEX: Anti Nuclear Antibody (ANA): NEGATIVE

## 2020-04-28 LAB — CYTOLOGY - PAP: Diagnosis: NEGATIVE

## 2020-07-14 ENCOUNTER — Encounter: Payer: Self-pay | Admitting: Unknown Physician Specialty

## 2020-07-14 DIAGNOSIS — L700 Acne vulgaris: Secondary | ICD-10-CM

## 2020-07-30 ENCOUNTER — Other Ambulatory Visit: Payer: Self-pay | Admitting: Dermatology

## 2020-08-05 ENCOUNTER — Other Ambulatory Visit: Payer: Self-pay | Admitting: Dermatology

## 2021-03-28 NOTE — Progress Notes (Signed)
BP 103/70   Pulse 71   Temp 98.2 F (36.8 C) (Oral)   Ht 5' 1.81" (1.57 m)   Wt 157 lb (71.2 kg)   SpO2 99%   BMI 28.89 kg/m    Subjective:    Patient ID: Meagan White, female    DOB: 02-25-1986, 35 y.o.   MRN: [id number]  HPI: Meagan White is a 35 y.o. female presenting on 03/29/2021 for comprehensive medical examination. Current medical complaints include:none  She currently lives with: Menopausal Symptoms: no   Denies HA, CP, SOB, dizziness, palpitations, visual changes, and lower extremity swelling.   Depression Screen done today and results listed below:  Depression screen [facility] 2/9 03/29/2021 04/23/2020 03/28/2019 02/01/2018  Decreased Interest 0 0 0 0  Down, Depressed, Hopeless 0 0 0 0  PHQ - 2 Score 0 0 0 0  Altered sleeping 0 - - 0  Tired, decreased energy 0 - - 1  Change in appetite 0 - - 0  Feeling bad or failure about yourself  0 - - 0  Trouble concentrating 0 - - 0  Moving slowly or fidgety/restless 0 - - 0  Suicidal thoughts 0 - - 0  PHQ-9 Score 0 - - 1  Difficult doing work/chores Not difficult at all - - -    The patient does not have a history of falls. I did complete a risk assessment for falls. A plan of care for falls was documented.   Past Medical History:  Past Medical History:  Diagnosis Date   Anemia    Skin disease     Surgical History:  Past Surgical History:  Procedure Laterality Date   VAGINA SURGERY      Medications:  Current Outpatient Medications on File Prior to Visit  Medication Sig   cycloSPORINE (RESTASIS) 0.05 % ophthalmic emulsion PLACE 1 DROP INTO BOTH EYES 2 TIMES DAILY.   doxycycline (PERIOSTAT) 20 MG tablet TAKE 2 TABLETS BY MOUTH DAILY   Ivermectin 1 % CREA APPLY TO FACE EVERY NIGHT AT BEDTIME   Azelaic Acid 15 % cream Apply topically. (Patient not taking: Reported on 03/29/2021)   butalbital-acetaminophen-caffeine (FIORICET WITH CODEINE) 50-325-40-30 MG capsule Take 1 capsule by mouth every 4 (four)  hours as needed for headache. (Patient not taking: Reported on 03/29/2021)   clindamycin (CLEOCIN T) 1 % external solution Apply topically every morning. (Patient not taking: Reported on 03/29/2021)   No current facility-administered medications on file prior to visit.    Allergies:  No Known Allergies  Social History:  Social History   Socioeconomic History   Marital status: Single    Spouse name: Not on file   Number of children: Not on file   Years of education: Not on file   Highest education level: Not on file  Occupational History   Not on file  Tobacco Use   Smoking status: Never   Smokeless tobacco: Never  Vaping Use   Vaping Use: Never used  Substance and Sexual Activity   Alcohol use: Yes    Comment: Socially   Drug use: Never   Sexual activity: Yes    Birth control/protection: Condom  Other Topics Concern   Not on file  Social History Narrative   Not on file   Social Determinants of Health   Financial Resource Strain: Not on file  Food Insecurity: Not on file  Transportation Needs: Not on file  Physical Activity: Not on file  Stress: Not on file  Social Connections: Not  on file  Intimate Partner Violence: Not on file   Social History   Tobacco Use  Smoking Status Never  Smokeless Tobacco Never   Social History   Substance and Sexual Activity  Alcohol Use Yes   Comment: Socially    Family History:  Family History  Problem Relation Age of Onset   Hypertension Father     Past medical history, surgical history, medications, allergies, family history and social history reviewed with patient today and changes made to appropriate areas of the chart.   Review of Systems  Eyes:  Negative for blurred vision and double vision.  Respiratory:  Negative for shortness of breath.   Cardiovascular:  Negative for chest pain, palpitations and leg swelling.  Neurological:  Negative for dizziness and headaches.  All other ROS negative except what is  listed above and in the HPI.      Objective:    BP 103/70   Pulse 71   Temp 98.2 F (36.8 C) (Oral)   Ht 5' 1.81" (1.57 m)   Wt 157 lb (71.2 kg)   SpO2 99%   BMI 28.89 kg/m   Wt Readings from Last 3 Encounters:  03/29/21 157 lb (71.2 kg)  04/23/20 153 lb (69.4 kg)  03/28/19 155 lb 4 oz (70.4 kg)    Physical Exam Vitals and nursing note reviewed.  Constitutional:      General: She is awake. She is not in acute distress.    Appearance: She is well-developed. She is not ill-appearing.  HENT:     Head: Normocephalic and atraumatic.     Right Ear: Hearing, tympanic membrane, ear canal and external ear normal. No drainage.     Left Ear: Hearing, tympanic membrane, ear canal and external ear normal. No drainage.     Nose: Nose normal.     Right Sinus: No maxillary sinus tenderness or frontal sinus tenderness.     Left Sinus: No maxillary sinus tenderness or frontal sinus tenderness.     Mouth/Throat:     Mouth: Mucous membranes are moist.     Pharynx: Oropharynx is clear. Uvula midline. No pharyngeal swelling, oropharyngeal exudate or posterior oropharyngeal erythema.  Eyes:     General: Lids are normal.        Right eye: No discharge.        Left eye: No discharge.     Extraocular Movements: Extraocular movements intact.     Conjunctiva/sclera: Conjunctivae normal.     Pupils: Pupils are equal, round, and reactive to light.     Visual Fields: Right eye visual fields normal and left eye visual fields normal.  Neck:     Thyroid: No thyromegaly.     Vascular: No carotid bruit.     Trachea: Trachea normal.  Cardiovascular:     Rate and Rhythm: Normal rate and regular rhythm.     Heart sounds: Normal heart sounds. No murmur heard.   No gallop.  Pulmonary:     Effort: Pulmonary effort is normal. No accessory muscle usage or respiratory distress.     Breath sounds: Normal breath sounds.  Chest:  Breasts:    Right: Normal.     Left: Normal.  Abdominal:     General: Bowel  sounds are normal.     Palpations: Abdomen is soft. There is no hepatomegaly or splenomegaly.     Tenderness: There is no abdominal tenderness.  Musculoskeletal:        General: Normal range of motion.  Cervical back: Normal range of motion and neck supple.     Right lower leg: No edema.     Left lower leg: No edema.  Lymphadenopathy:     Head:     Right side of head: No submental, submandibular, tonsillar, preauricular or posterior auricular adenopathy.     Left side of head: No submental, submandibular, tonsillar, preauricular or posterior auricular adenopathy.     Cervical: No cervical adenopathy.     Upper Body:     Right upper body: No supraclavicular, axillary or pectoral adenopathy.     Left upper body: No supraclavicular, axillary or pectoral adenopathy.  Skin:    General: Skin is warm and dry.     Capillary Refill: Capillary refill takes less than 2 seconds.     Findings: No rash.  Neurological:     Mental Status: She is alert and oriented to person, place, and time.     Gait: Gait is intact.     Deep Tendon Reflexes: Reflexes are normal and symmetric.     Reflex Scores:      Brachioradialis reflexes are 2+ on the right side and 2+ on the left side.      Patellar reflexes are 2+ on the right side and 2+ on the left side. Psychiatric:        Attention and Perception: Attention normal.        Mood and Affect: Mood normal.        Speech: Speech normal.        Behavior: Behavior normal. Behavior is cooperative.        Thought Content: Thought content normal.        Judgment: Judgment normal.    Results for orders placed or performed in visit on 04/23/20  CBC with Differential/Platelet  Result Value Ref Range   WBC 5.9 3.4 - 10.8 x10E3/uL   RBC 4.52 3.77 - 5.28 x10E6/uL   Hemoglobin 12.7 11.1 - 15.9 g/dL   Hematocrit 75.1 02.5 - 46.6 %   MCV 88 79 - 97 fL   MCH 28.1 26.6 - 33.0 pg   MCHC 32.1 31.5 - 35.7 g/dL   RDW 85.2 77.8 - 24.2 %   Platelets 232 150 - 450  x10E3/uL   Neutrophils 61 Not Estab. %   Lymphs 28 Not Estab. %   Monocytes 7 Not Estab. %   Eos 3 Not Estab. %   Basos 1 Not Estab. %   Neutrophils Absolute 3.6 1.4 - 7.0 x10E3/uL   Lymphocytes Absolute 1.6 0.7 - 3.1 x10E3/uL   Monocytes Absolute 0.4 0.1 - 0.9 x10E3/uL   EOS (ABSOLUTE) 0.2 0.0 - 0.4 x10E3/uL   Basophils Absolute 0.0 0.0 - 0.2 x10E3/uL   Immature Granulocytes 0 Not Estab. %   Immature Grans (Abs) 0.0 0.0 - 0.1 x10E3/uL  Comprehensive metabolic panel  Result Value Ref Range   Glucose 93 65 - 99 mg/dL   BUN 15 6 - 20 mg/dL   Creatinine, Ser 3.53 0.57 - 1.00 mg/dL   GFR calc non Af Amer 118 >59 mL/min/1.73   GFR calc Af Amer 137 >59 mL/min/1.73   BUN/Creatinine Ratio 23 9 - 23   Sodium 139 134 - 144 mmol/L   Potassium 4.1 3.5 - 5.2 mmol/L   Chloride 102 96 - 106 mmol/L   CO2 25 20 - 29 mmol/L   Calcium 9.0 8.7 - 10.2 mg/dL   Total Protein 7.0 6.0 - 8.5 g/dL   Albumin 4.2 3.8 -  4.8 g/dL   Globulin, Total 2.8 1.5 - 4.5 g/dL   Albumin/Globulin Ratio 1.5 1.2 - 2.2   Bilirubin Total 0.5 0.0 - 1.2 mg/dL   Alkaline Phosphatase 108 44 - 121 IU/L   AST 16 0 - 40 IU/L   ALT 27 0 - 32 IU/L  TSH  Result Value Ref Range   TSH 2.660 0.450 - 4.500 uIU/mL  Lipid Panel w/o Chol/HDL Ratio  Result Value Ref Range   Cholesterol, Total 153 100 - 199 mg/dL   Triglycerides 494 0 - 149 mg/dL   HDL 41 >49 mg/dL   VLDL Cholesterol Cal 23 5 - 40 mg/dL   LDL Chol Calc (NIH) 89 0 - 99 mg/dL  ANA w/Reflex  Result Value Ref Range   Anti Nuclear Antibody (ANA) Negative Negative  Cytology - PAP  Result Value Ref Range   Adequacy      Satisfactory for evaluation; transformation zone component PRESENT.   Diagnosis      - Negative for intraepithelial lesion or malignancy (NILM)      Assessment & Plan:   Problem List Items Addressed This Visit       Other   RESOLVED: Obesity (BMI 30.0-34.9)   Other Visit Diagnoses     Annual physical exam    -  Primary   Health maintenance  reviewed during visit today. Labs ordered. PAP up to date.    Relevant Orders   CBC with Differential/Platelet   Comprehensive metabolic panel   Lipid panel   TSH   Urinalysis, Routine w reflex microscopic   Encounter for hepatitis C screening test for low risk patient       Relevant Orders   Hepatitis C Antibody        Follow up plan: Return in about 1 year (around 03/29/2022) for Physical and Fasting labs.   LABORATORY TESTING:  - Pap smear: up to date  IMMUNIZATIONS:   - Tdap: Tetanus vaccination status reviewed: last tetanus booster within 10 years. - Influenza: Given elsewhere - Pneumovax: Not applicable - Prevnar: Not applicable - HPV:  Discussed at visit today. - Zostavax vaccine: Not applicable  SCREENING: -Mammogram: Not applicable  - Colonoscopy: Not applicable  - Bone Density: Not applicable  -Hearing Test: Not applicable  -Spirometry: Not applicable   PATIENT COUNSELING:   Advised to take 1 mg of folate supplement per day if capable of pregnancy.   Sexuality: Discussed sexually transmitted diseases, partner selection, use of condoms, avoidance of unintended pregnancy  and contraceptive alternatives.   Advised to avoid cigarette smoking.  I discussed with the patient that most people either abstain from alcohol or drink within safe limits (<=14/week and <=4 drinks/occasion for males, <=7/weeks and <= 3 drinks/occasion for females) and that the risk for alcohol disorders and other health effects rises proportionally with the number of drinks per week and how often a drinker exceeds daily limits.  Discussed cessation/primary prevention of drug use and availability of treatment for abuse.   Diet: Encouraged to adjust caloric intake to maintain  or achieve ideal body weight, to reduce intake of dietary saturated fat and total fat, to limit sodium intake by avoiding high sodium foods and not adding table salt, and to maintain adequate dietary potassium and calcium  preferably from fresh fruits, vegetables, and low-fat dairy products.    stressed the importance of regular exercise  Injury prevention: Discussed safety belts, safety helmets, smoke detector, smoking near bedding or upholstery.   Dental health: Discussed importance  of regular tooth brushing, flossing, and dental visits.    NEXT PREVENTATIVE PHYSICAL DUE IN 1 YEAR. Return in about 1 year (around 03/29/2022) for Physical and Fasting labs.

## 2021-03-29 ENCOUNTER — Encounter: Payer: Self-pay | Admitting: Nurse Practitioner

## 2021-03-29 ENCOUNTER — Ambulatory Visit (INDEPENDENT_AMBULATORY_CARE_PROVIDER_SITE_OTHER): Payer: No Typology Code available for payment source | Admitting: Nurse Practitioner

## 2021-03-29 ENCOUNTER — Other Ambulatory Visit: Payer: Self-pay

## 2021-03-29 VITALS — BP 103/70 | HR 71 | Temp 98.2°F | Ht 61.81 in | Wt 157.0 lb

## 2021-03-29 DIAGNOSIS — Z1159 Encounter for screening for other viral diseases: Secondary | ICD-10-CM | POA: Diagnosis not present

## 2021-03-29 DIAGNOSIS — E669 Obesity, unspecified: Secondary | ICD-10-CM | POA: Diagnosis not present

## 2021-03-29 DIAGNOSIS — Z0000 Encounter for general adult medical examination without abnormal findings: Secondary | ICD-10-CM

## 2021-03-29 DIAGNOSIS — R8290 Unspecified abnormal findings in urine: Secondary | ICD-10-CM

## 2021-03-29 DIAGNOSIS — R7989 Other specified abnormal findings of blood chemistry: Secondary | ICD-10-CM

## 2021-03-29 LAB — URINALYSIS, ROUTINE W REFLEX MICROSCOPIC
Bilirubin, UA: NEGATIVE
Glucose, UA: NEGATIVE
Ketones, UA: NEGATIVE
Leukocytes,UA: NEGATIVE
Nitrite, UA: NEGATIVE
Protein,UA: NEGATIVE
Specific Gravity, UA: >1.03 — ABNORMAL HIGH (ref 1.005–1.030)
Urobilinogen, Ur: 0.2 mg/dL (ref 0.2–1.0)
pH, UA: 5.5 (ref 5.0–7.5)

## 2021-03-29 LAB — MICROSCOPIC EXAMINATION: WBC, UA: NONE SEEN /hpf (ref 0–5)

## 2021-03-29 NOTE — Addendum Note (Signed)
Addended by: Larae Grooms on: 03/29/2021 08:48 AM   Modules accepted: Orders

## 2021-03-29 NOTE — Progress Notes (Signed)
Hi Meagan White. Your urine had a good amount of bacteria in it.  I have sent it for culture and will let you know if you need further treatment.

## 2021-03-30 LAB — CBC WITH DIFFERENTIAL/PLATELET
Basophils Absolute: 0 10*3/uL (ref 0.0–0.2)
Basos: 1 %
EOS (ABSOLUTE): 0.2 10*3/uL (ref 0.0–0.4)
Eos: 3 %
Hematocrit: 40.7 % (ref 34.0–46.6)
Hemoglobin: 13.1 g/dL (ref 11.1–15.9)
Immature Grans (Abs): 0 10*3/uL (ref 0.0–0.1)
Immature Granulocytes: 0 %
Lymphocytes Absolute: 1.7 10*3/uL (ref 0.7–3.1)
Lymphs: 28 %
MCH: 28.6 pg (ref 26.6–33.0)
MCHC: 32.2 g/dL (ref 31.5–35.7)
MCV: 89 fL (ref 79–97)
Monocytes Absolute: 0.5 10*3/uL (ref 0.1–0.9)
Monocytes: 8 %
Neutrophils Absolute: 3.5 10*3/uL (ref 1.4–7.0)
Neutrophils: 60 %
Platelets: 213 10*3/uL (ref 150–450)
RBC: 4.58 x10E6/uL (ref 3.77–5.28)
RDW: 12.4 % (ref 11.7–15.4)
WBC: 5.8 10*3/uL (ref 3.4–10.8)

## 2021-03-30 LAB — COMPREHENSIVE METABOLIC PANEL
ALT: 20 IU/L (ref 0–32)
AST: 18 IU/L (ref 0–40)
Albumin/Globulin Ratio: 1.9 (ref 1.2–2.2)
Albumin: 4.6 g/dL (ref 3.8–4.8)
Alkaline Phosphatase: 93 IU/L (ref 44–121)
BUN/Creatinine Ratio: 22 (ref 9–23)
BUN: 14 mg/dL (ref 6–20)
Bilirubin Total: 0.3 mg/dL (ref 0.0–1.2)
CO2: 22 mmol/L (ref 20–29)
Calcium: 9 mg/dL (ref 8.7–10.2)
Chloride: 101 mmol/L (ref 96–106)
Creatinine, Ser: 0.64 mg/dL (ref 0.57–1.00)
Globulin, Total: 2.4 g/dL (ref 1.5–4.5)
Glucose: 97 mg/dL (ref 70–99)
Potassium: 4.1 mmol/L (ref 3.5–5.2)
Sodium: 139 mmol/L (ref 134–144)
Total Protein: 7 g/dL (ref 6.0–8.5)
eGFR: 120 mL/min/{1.73_m2} (ref >59)

## 2021-03-30 LAB — LIPID PANEL
Chol/HDL Ratio: 3.1 ratio (ref 0.0–4.4)
Cholesterol, Total: 143 mg/dL (ref 100–199)
HDL: 46 mg/dL (ref >39)
LDL Chol Calc (NIH): 79 mg/dL (ref 0–99)
Triglycerides: 99 mg/dL (ref 0–149)
VLDL Cholesterol Cal: 18 mg/dL (ref 5–40)

## 2021-03-30 LAB — HEPATITIS C ANTIBODY: Hep C Virus Ab: <0.1 s/co ratio (ref 0.0–0.9)

## 2021-03-30 LAB — TSH: TSH: 4.7 u[IU]/mL — ABNORMAL HIGH (ref 0.450–4.500)

## 2021-03-30 NOTE — Progress Notes (Signed)
Please let patient know that her lab work looks good overall.  Her thyroid is slightly elevated. I'd like to repeat it in 6 weeks to see if it goes back to normal.  Please have her make a lab appt.

## 2021-03-30 NOTE — Addendum Note (Signed)
Addended by: Larae Grooms on: 03/30/2021 08:04 AM   Modules accepted: Orders

## 2021-04-03 LAB — URINE CULTURE

## 2021-04-05 NOTE — Progress Notes (Signed)
Hi Meagan White.  Your urine culture did not grow any bacteria.  No need for further treatment at this time.  Please let me know if you have any questions.

## 2021-05-11 ENCOUNTER — Other Ambulatory Visit: Payer: Self-pay

## 2021-05-11 ENCOUNTER — Other Ambulatory Visit: Payer: No Typology Code available for payment source

## 2021-05-11 DIAGNOSIS — R7989 Other specified abnormal findings of blood chemistry: Secondary | ICD-10-CM

## 2021-05-12 LAB — THYROID PANEL WITH TSH
Free Thyroxine Index: 2.1 (ref 1.2–4.9)
T3 Uptake Ratio: 33 % (ref 24–39)
T4, Total: 6.3 ug/dL (ref 4.5–12.0)
TSH: 3.92 u[IU]/mL (ref 0.450–4.500)

## 2021-05-12 NOTE — Progress Notes (Signed)
Hi Gracin.  Your thyroid labs returned to normal.  We will recheck this in the future.  Please let me know if you have any questions.

## 2021-06-02 ENCOUNTER — Other Ambulatory Visit: Payer: Self-pay | Admitting: Unknown Physician Specialty

## 2021-06-02 NOTE — Telephone Encounter (Signed)
Requested medications are due for refill today.  unsure  Requested medications are on the active medications list.  no  Last refill. 04/23/2020  Future visit scheduled.   yes  Notes to clinic.  Medication not on med list. Medication cannot be delegated.    Requested Prescriptions  Pending Prescriptions Disp Refills   cycloSPORINE (RESTASIS) 0.05 % ophthalmic emulsion 60 mL 12    Sig: PLACE 1 DROP INTO BOTH EYES 2 TIMES DAILY.     Not Delegated - Immunology:  Immunosuppressive Agents - cyclosporine (Ocular) Failed - 06/02/2021 10:18 PM      Failed - This refill cannot be delegated      Passed - Valid encounter within last 12 months    Recent Outpatient Visits           2 months ago Annual physical exam   [facility] Larae Grooms, NP   1 year ago Dry eyes   [facility] Gabriel Cirri, NP   2 years ago Annual physical exam   [facility] [place], [place], [place]   2 years ago Bilateral impacted cerumen   [facility] [place] Maser [place], [place]   3 years ago Dry eyes   [facility] [place], [place], [place]       Future Appointments             In 10 months Larae Grooms, NP [facility]

## 2021-06-03 ENCOUNTER — Other Ambulatory Visit: Payer: Self-pay

## 2021-06-23 ENCOUNTER — Encounter: Payer: Self-pay | Admitting: Nurse Practitioner

## 2021-07-18 MED FILL — Ivermectin Cream 1%: CUTANEOUS | 30 days supply | Qty: 45 | Fill #0 | Status: AC

## 2021-07-19 ENCOUNTER — Other Ambulatory Visit: Payer: Self-pay

## 2021-07-20 ENCOUNTER — Other Ambulatory Visit: Payer: Self-pay

## 2021-08-18 ENCOUNTER — Other Ambulatory Visit: Payer: Self-pay

## 2021-08-18 MED ORDER — CYCLOSPORINE 0.05 % OP EMUL
OPHTHALMIC | 3 refills | Status: DC
  Filled 2021-08-18: qty 180, 90d supply, fill #0

## 2021-08-19 ENCOUNTER — Other Ambulatory Visit: Payer: Self-pay

## 2021-11-17 ENCOUNTER — Other Ambulatory Visit: Payer: Self-pay

## 2021-11-17 MED ORDER — DOXYCYCLINE HYCLATE 100 MG PO CAPS
ORAL_CAPSULE | ORAL | 2 refills | Status: DC
  Filled 2021-11-17: qty 60, 30d supply, fill #0
  Filled 2021-12-20: qty 60, 30d supply, fill #1
  Filled 2022-02-10: qty 60, 30d supply, fill #2

## 2021-11-17 MED ORDER — RHOFADE 1 % EX CREA
TOPICAL_CREAM | CUTANEOUS | 5 refills | Status: DC
  Filled 2021-11-17: qty 30, 30d supply, fill #0
  Filled 2021-11-30: qty 30, 20d supply, fill #0
  Filled 2021-12-09: qty 30, 30d supply, fill #0

## 2021-11-17 MED ORDER — IVERMECTIN 1 % EX CREA
TOPICAL_CREAM | CUTANEOUS | 3 refills | Status: DC
  Filled 2021-11-17 – 2021-12-22 (×4): qty 45, 30d supply, fill #0
  Filled 2022-06-20: qty 45, 30d supply, fill #1

## 2021-11-18 ENCOUNTER — Other Ambulatory Visit: Payer: Self-pay

## 2021-11-19 ENCOUNTER — Other Ambulatory Visit: Payer: Self-pay

## 2021-11-22 ENCOUNTER — Other Ambulatory Visit: Payer: Self-pay

## 2021-11-30 ENCOUNTER — Other Ambulatory Visit: Payer: Self-pay

## 2021-12-09 ENCOUNTER — Other Ambulatory Visit: Payer: Self-pay

## 2021-12-10 ENCOUNTER — Other Ambulatory Visit: Payer: Self-pay

## 2021-12-15 ENCOUNTER — Other Ambulatory Visit: Payer: Self-pay

## 2021-12-20 ENCOUNTER — Other Ambulatory Visit: Payer: Self-pay

## 2021-12-22 ENCOUNTER — Other Ambulatory Visit: Payer: Self-pay

## 2021-12-23 ENCOUNTER — Other Ambulatory Visit: Payer: Self-pay

## 2021-12-27 ENCOUNTER — Other Ambulatory Visit: Payer: Self-pay

## 2022-01-11 ENCOUNTER — Other Ambulatory Visit: Payer: Self-pay

## 2022-02-11 ENCOUNTER — Other Ambulatory Visit: Payer: Self-pay

## 2022-03-29 NOTE — Progress Notes (Unsigned)
There were no vitals taken for this visit.   Subjective:    Patient ID: Meagan White, female    DOB: 05/16/1986, 36 y.o.   MRN: [id number]  HPI: Meagan White is a 36 y.o. female presenting on 03/30/2022 for comprehensive medical examination. Current medical complaints include:none  She currently lives with: Menopausal Symptoms: no   Denies HA, CP, SOB, dizziness, palpitations, visual changes, and lower extremity swelling.   Depression Screen done today and results listed below:     03/29/2021    8:10 AM 04/23/2020    8:08 AM 03/28/2019    8:51 AM 02/01/2018    9:12 AM  Depression screen PHQ 2/9  Decreased Interest 0 0 0 0  Down, Depressed, Hopeless 0 0 0 0  PHQ - 2 Score 0 0 0 0  Altered sleeping 0   0  Tired, decreased energy 0   1  Change in appetite 0   0  Feeling bad or failure about yourself  0   0  Trouble concentrating 0   0  Moving slowly or fidgety/restless 0   0  Suicidal thoughts 0   0  PHQ-9 Score 0   1  Difficult doing work/chores Not difficult at all       The patient does not have a history of falls. I did complete a risk assessment for falls. A plan of care for falls was documented.   Past Medical History:  Past Medical History:  Diagnosis Date  . Anemia   . Skin disease     Surgical History:  Past Surgical History:  Procedure Laterality Date  . VAGINA SURGERY      Medications:  Current Outpatient Medications on File Prior to Visit  Medication Sig  . Azelaic Acid 15 % cream Apply topically. (Patient not taking: Reported on 03/29/2021)  . butalbital-acetaminophen-caffeine (FIORICET WITH CODEINE) 50-325-40-30 MG capsule Take 1 capsule by mouth every 4 (four) hours as needed for headache. (Patient not taking: Reported on 03/29/2021)  . clindamycin (CLEOCIN T) 1 % external solution Apply topically every morning. (Patient not taking: Reported on 03/29/2021)  . cycloSPORINE (RESTASIS) 0.05 % ophthalmic emulsion Location: Both Eyes. 1  GTT OU BID  . doxycycline (VIBRAMYCIN) 100 MG capsule 1(one) tablet(s) oral 2(two) times a day  . Ivermectin (SOOLANTRA) 1 % CREA 1(one) application(s) topical every day before noon  . Oxymetazoline HCl (RHOFADE) 1 % CREA 1(one) application(s) topical every night at bedtime   No current facility-administered medications on file prior to visit.    Allergies:  No Known Allergies  Social History:  Social History   Socioeconomic History  . Marital status: Single    Spouse name: Not on file  . Number of children: Not on file  . Years of education: Not on file  . Highest education level: Not on file  Occupational History  . Not on file  Tobacco Use  . Smoking status: Never  . Smokeless tobacco: Never  Vaping Use  . Vaping Use: Never used  Substance and Sexual Activity  . Alcohol use: Yes    Comment: Socially  . Drug use: Never  . Sexual activity: Yes    Birth control/protection: Condom  Other Topics Concern  . Not on file  Social History Narrative  . Not on file   Social Determinants of Health   Financial Resource Strain: Not on file  Food Insecurity: Not on file  Transportation Needs: Not on file  Physical Activity: Not on file  Stress: Not on file  Social Connections: Not on file  Intimate Partner Violence: Not on file   Social History   Tobacco Use  Smoking Status Never  Smokeless Tobacco Never   Social History   Substance and Sexual Activity  Alcohol Use Yes   Comment: Socially    Family History:  Family History  Problem Relation Age of Onset  . Hypertension Father     Past medical history, surgical history, medications, allergies, family history and social history reviewed with patient today and changes made to appropriate areas of the chart.   Review of Systems  Eyes:  Negative for blurred vision and double vision.  Respiratory:  Negative for shortness of breath.   Cardiovascular:  Negative for chest pain, palpitations and leg swelling.   Neurological:  Negative for dizziness and headaches.  All other ROS negative except what is listed above and in the HPI.      Objective:    There were no vitals taken for this visit.  Wt Readings from Last 3 Encounters:  03/29/21 157 lb (71.2 kg)  04/23/20 153 lb (69.4 kg)  03/28/19 155 lb 4 oz (70.4 kg)    Physical Exam Vitals and nursing note reviewed.  Constitutional:      General: She is awake. She is not in acute distress.    Appearance: She is well-developed. She is not ill-appearing.  HENT:     Head: Normocephalic and atraumatic.     Right Ear: Hearing, tympanic membrane, ear canal and external ear normal. No drainage.     Left Ear: Hearing, tympanic membrane, ear canal and external ear normal. No drainage.     Nose: Nose normal.     Right Sinus: No maxillary sinus tenderness or frontal sinus tenderness.     Left Sinus: No maxillary sinus tenderness or frontal sinus tenderness.     Mouth/Throat:     Mouth: Mucous membranes are moist.     Pharynx: Oropharynx is clear. Uvula midline. No pharyngeal swelling, oropharyngeal exudate or posterior oropharyngeal erythema.  Eyes:     General: Lids are normal.        Right eye: No discharge.        Left eye: No discharge.     Extraocular Movements: Extraocular movements intact.     Conjunctiva/sclera: Conjunctivae normal.     Pupils: Pupils are equal, round, and reactive to light.     Visual Fields: Right eye visual fields normal and left eye visual fields normal.  Neck:     Thyroid: No thyromegaly.     Vascular: No carotid bruit.     Trachea: Trachea normal.  Cardiovascular:     Rate and Rhythm: Normal rate and regular rhythm.     Heart sounds: Normal heart sounds. No murmur heard.    No gallop.  Pulmonary:     Effort: Pulmonary effort is normal. No accessory muscle usage or respiratory distress.     Breath sounds: Normal breath sounds.  Chest:  Breasts:    Right: Normal.     Left: Normal.  Abdominal:     General:  Bowel sounds are normal.     Palpations: Abdomen is soft. There is no hepatomegaly or splenomegaly.     Tenderness: There is no abdominal tenderness.  Musculoskeletal:        General: Normal range of motion.     Cervical back: Normal range of motion and neck supple.     Right lower leg: No edema.  Left lower leg: No edema.  Lymphadenopathy:     Head:     Right side of head: No submental, submandibular, tonsillar, preauricular or posterior auricular adenopathy.     Left side of head: No submental, submandibular, tonsillar, preauricular or posterior auricular adenopathy.     Cervical: No cervical adenopathy.     Upper Body:     Right upper body: No supraclavicular, axillary or pectoral adenopathy.     Left upper body: No supraclavicular, axillary or pectoral adenopathy.  Skin:    General: Skin is warm and dry.     Capillary Refill: Capillary refill takes less than 2 seconds.     Findings: No rash.  Neurological:     Mental Status: She is alert and oriented to person, place, and time.     Gait: Gait is intact.     Deep Tendon Reflexes: Reflexes are normal and symmetric.     Reflex Scores:      Brachioradialis reflexes are 2+ on the right side and 2+ on the left side.      Patellar reflexes are 2+ on the right side and 2+ on the left side. Psychiatric:        Attention and Perception: Attention normal.        Mood and Affect: Mood normal.        Speech: Speech normal.        Behavior: Behavior normal. Behavior is cooperative.        Thought Content: Thought content normal.        Judgment: Judgment normal.    Results for orders placed or performed in visit on 05/11/21  Thyroid Panel With TSH  Result Value Ref Range   TSH 3.920 0.450 - 4.500 uIU/mL   T4, Total 6.3 4.5 - 12.0 ug/dL   T3 Uptake Ratio 33 24 - 39 %   Free Thyroxine Index 2.1 1.2 - 4.9      Assessment & Plan:   Problem List Items Addressed This Visit   None    Follow up plan: No follow-ups on  file.   LABORATORY TESTING:  - Pap smear: up to date  IMMUNIZATIONS:   - Tdap: Tetanus vaccination status reviewed: last tetanus booster within 10 years. - Influenza: Given elsewhere - Pneumovax: Not applicable - Prevnar: Not applicable - HPV:  Discussed at visit today. - Zostavax vaccine: Not applicable  SCREENING: -Mammogram: Not applicable  - Colonoscopy: Not applicable  - Bone Density: Not applicable  -Hearing Test: Not applicable  -Spirometry: Not applicable   PATIENT COUNSELING:   Advised to take 1 mg of folate supplement per day if capable of pregnancy.   Sexuality: Discussed sexually transmitted diseases, partner selection, use of condoms, avoidance of unintended pregnancy  and contraceptive alternatives.   Advised to avoid cigarette smoking.  I discussed with the patient that most people either abstain from alcohol or drink within safe limits (<=14/week and <=4 drinks/occasion for males, <=7/weeks and <= 3 drinks/occasion for females) and that the risk for alcohol disorders and other health effects rises proportionally with the number of drinks per week and how often a drinker exceeds daily limits.  Discussed cessation/primary prevention of drug use and availability of treatment for abuse.   Diet: Encouraged to adjust caloric intake to maintain  or achieve ideal body weight, to reduce intake of dietary saturated fat and total fat, to limit sodium intake by avoiding high sodium foods and not adding table salt, and to maintain adequate dietary potassium and  calcium preferably from fresh fruits, vegetables, and low-fat dairy products.    stressed the importance of regular exercise  Injury prevention: Discussed safety belts, safety helmets, smoke detector, smoking near bedding or upholstery.   Dental health: Discussed importance of regular tooth brushing, flossing, and dental visits.    NEXT PREVENTATIVE PHYSICAL DUE IN 1 YEAR. No follow-ups on  file.

## 2022-03-30 ENCOUNTER — Encounter: Payer: Self-pay | Admitting: Nurse Practitioner

## 2022-03-30 ENCOUNTER — Encounter: Payer: No Typology Code available for payment source | Admitting: Nurse Practitioner

## 2022-03-30 ENCOUNTER — Ambulatory Visit (INDEPENDENT_AMBULATORY_CARE_PROVIDER_SITE_OTHER): Payer: No Typology Code available for payment source | Admitting: Nurse Practitioner

## 2022-03-30 VITALS — BP 93/63 | HR 66 | Temp 97.7°F | Ht 62.8 in | Wt 165.5 lb

## 2022-03-30 DIAGNOSIS — H6121 Impacted cerumen, right ear: Secondary | ICD-10-CM

## 2022-03-30 DIAGNOSIS — Z0000 Encounter for general adult medical examination without abnormal findings: Secondary | ICD-10-CM | POA: Diagnosis not present

## 2022-03-30 DIAGNOSIS — R5383 Other fatigue: Secondary | ICD-10-CM | POA: Diagnosis not present

## 2022-03-30 DIAGNOSIS — Z136 Encounter for screening for cardiovascular disorders: Secondary | ICD-10-CM

## 2022-03-30 LAB — URINALYSIS, ROUTINE W REFLEX MICROSCOPIC
Bilirubin, UA: NEGATIVE
Glucose, UA: NEGATIVE
Ketones, UA: NEGATIVE
Leukocytes,UA: NEGATIVE
Nitrite, UA: NEGATIVE
Protein,UA: NEGATIVE
RBC, UA: NEGATIVE
Specific Gravity, UA: >1.03 — ABNORMAL HIGH (ref 1.005–1.030)
Urobilinogen, Ur: 0.2 mg/dL (ref 0.2–1.0)
pH, UA: 5.5 (ref 5.0–7.5)

## 2022-03-30 NOTE — Assessment & Plan Note (Signed)
Pt complains of consistent fatigue despite adequate sleep obtained each night. Obtaining TSH, Vit D and CBC to r/o possible cause of fatigue. Will f/u with patient with lab results and discuss further plan as needed.

## 2022-03-30 NOTE — Assessment & Plan Note (Signed)
Lipid panel obtained this visit. Will f/u with pt on results and necessary lifestyle modifications as necessary.

## 2022-03-30 NOTE — Assessment & Plan Note (Signed)
Cerumen impaction noted to right ear. Ear lavage completed this visit.

## 2022-03-31 LAB — COMPREHENSIVE METABOLIC PANEL
ALT: 15 IU/L (ref 0–32)
AST: 18 IU/L (ref 0–40)
Albumin/Globulin Ratio: 1.8 (ref 1.2–2.2)
Albumin: 4.5 g/dL (ref 3.9–4.9)
Alkaline Phosphatase: 96 IU/L (ref 44–121)
BUN/Creatinine Ratio: 22 (ref 9–23)
BUN: 15 mg/dL (ref 6–20)
Bilirubin Total: 0.5 mg/dL (ref 0.0–1.2)
CO2: 25 mmol/L (ref 20–29)
Calcium: 9.2 mg/dL (ref 8.7–10.2)
Chloride: 100 mmol/L (ref 96–106)
Creatinine, Ser: 0.69 mg/dL (ref 0.57–1.00)
Globulin, Total: 2.5 g/dL (ref 1.5–4.5)
Glucose: 94 mg/dL (ref 70–99)
Potassium: 4.3 mmol/L (ref 3.5–5.2)
Sodium: 139 mmol/L (ref 134–144)
Total Protein: 7 g/dL (ref 6.0–8.5)
eGFR: 117 mL/min/{1.73_m2} (ref >59)

## 2022-03-31 LAB — CBC WITH DIFFERENTIAL/PLATELET
Basophils Absolute: 0 10*3/uL (ref 0.0–0.2)
Basos: 1 %
EOS (ABSOLUTE): 0.2 10*3/uL (ref 0.0–0.4)
Eos: 4 %
Hematocrit: 39.4 % (ref 34.0–46.6)
Hemoglobin: 12.4 g/dL (ref 11.1–15.9)
Immature Grans (Abs): 0 10*3/uL (ref 0.0–0.1)
Immature Granulocytes: 0 %
Lymphocytes Absolute: 1.4 10*3/uL (ref 0.7–3.1)
Lymphs: 27 %
MCH: 28.1 pg (ref 26.6–33.0)
MCHC: 31.5 g/dL (ref 31.5–35.7)
MCV: 89 fL (ref 79–97)
Monocytes Absolute: 0.3 10*3/uL (ref 0.1–0.9)
Monocytes: 7 %
Neutrophils Absolute: 3.3 10*3/uL (ref 1.4–7.0)
Neutrophils: 61 %
Platelets: 211 10*3/uL (ref 150–450)
RBC: 4.41 x10E6/uL (ref 3.77–5.28)
RDW: 12.5 % (ref 11.7–15.4)
WBC: 5.2 10*3/uL (ref 3.4–10.8)

## 2022-03-31 LAB — LIPID PANEL
Chol/HDL Ratio: 3.4 ratio (ref 0.0–4.4)
Cholesterol, Total: 171 mg/dL (ref 100–199)
HDL: 50 mg/dL (ref >39)
LDL Chol Calc (NIH): 101 mg/dL — ABNORMAL HIGH (ref 0–99)
Triglycerides: 113 mg/dL (ref 0–149)
VLDL Cholesterol Cal: 20 mg/dL (ref 5–40)

## 2022-03-31 LAB — TSH: TSH: 2.46 u[IU]/mL (ref 0.450–4.500)

## 2022-03-31 LAB — VITAMIN D 25 HYDROXY (VIT D DEFICIENCY, FRACTURES): Vit D, 25-Hydroxy: 16.6 ng/mL — ABNORMAL LOW (ref 30.0–100.0)

## 2022-04-01 NOTE — Progress Notes (Signed)
Hi Meagan White.  It was nice to see you yesterday.  Your lab work looks good.  Your vitamin D is low.  I recommend starting a vitamin D supplement of 1000mg  daily.  This can be found over the counter.  This could be contributing to your fatigue.  No concerns at this time. Continue with your current medication regimen.  Follow up as discussed.  Please let me know if you have any questions.

## 2022-06-13 ENCOUNTER — Other Ambulatory Visit: Payer: Self-pay

## 2022-06-13 DIAGNOSIS — H04129 Dry eye syndrome of unspecified lacrimal gland: Secondary | ICD-10-CM | POA: Diagnosis not present

## 2022-06-13 DIAGNOSIS — H5213 Myopia, bilateral: Secondary | ICD-10-CM | POA: Diagnosis not present

## 2022-06-13 MED ORDER — CYCLOSPORINE 0.05 % OP EMUL
1.0000 [drp] | Freq: Two times a day (BID) | OPHTHALMIC | 3 refills | Status: DC
  Filled 2022-06-13: qty 60, 30d supply, fill #0

## 2022-06-20 ENCOUNTER — Other Ambulatory Visit: Payer: Self-pay

## 2022-10-14 ENCOUNTER — Other Ambulatory Visit: Payer: Self-pay

## 2023-04-03 ENCOUNTER — Ambulatory Visit: Payer: 59 | Admitting: Nurse Practitioner

## 2023-04-03 ENCOUNTER — Encounter: Payer: Self-pay | Admitting: Nurse Practitioner

## 2023-04-03 VITALS — BP 96/62 | HR 66 | Temp 98.7°F | Ht 62.2 in | Wt 166.0 lb

## 2023-04-03 DIAGNOSIS — G8929 Other chronic pain: Secondary | ICD-10-CM

## 2023-04-03 DIAGNOSIS — M545 Low back pain, unspecified: Secondary | ICD-10-CM | POA: Diagnosis not present

## 2023-04-03 DIAGNOSIS — Z136 Encounter for screening for cardiovascular disorders: Secondary | ICD-10-CM

## 2023-04-03 DIAGNOSIS — Z0000 Encounter for general adult medical examination without abnormal findings: Secondary | ICD-10-CM | POA: Diagnosis not present

## 2023-04-03 LAB — URINALYSIS, ROUTINE W REFLEX MICROSCOPIC
Bilirubin, UA: NEGATIVE
Glucose, UA: NEGATIVE
Ketones, UA: NEGATIVE
Nitrite, UA: NEGATIVE
RBC, UA: NEGATIVE
Specific Gravity, UA: 1.025 (ref 1.005–1.030)
Urobilinogen, Ur: 1 mg/dL (ref 0.2–1.0)
pH, UA: 6.5 (ref 5.0–7.5)

## 2023-04-03 LAB — MICROSCOPIC EXAMINATION: Bacteria, UA: NONE SEEN

## 2023-04-03 NOTE — Progress Notes (Signed)
BP 96/62   Pulse 66   Temp 98.7 F (37.1 C) (Oral)   Ht 5' 2.2" (1.58 m)   Wt 166 lb (75.3 kg)   LMP 03/13/2023 (Approximate)   SpO2 99%   BMI 30.17 kg/m    Subjective:    Patient ID: Meagan White, female    DOB: [birth year], 36 y.o.   MRN: [id number]  HPI: Meagan White is a 37 y.o. female presenting on 04/03/2023 for comprehensive medical examination. Current medical complaints include:none  She currently lives with: Menopausal Symptoms: no  Patient states she has been having some ringing in her right ear.  Started a couple of weeks ago.  Denies any congestion, sore throat, and ear pain.    BACK PAIN When she stands to long or when she makes a sudden movement it causes her back to spasm.  Having difficulty sleeping because it is hard to turn in the bed. Duration: months Mechanism of injury: unknown Location: Left and low back Onset: gradual Severity: 6/10 Quality: sharp Frequency: constant Radiation: none Aggravating factors:  standing sneezing, lifting, and movement Alleviating factors: nothing Status: stable Treatments attempted: none  Relief with NSAIDs?: No NSAIDs Taken Nighttime pain:  yes Paresthesias / decreased sensation:  no Bowel / bladder incontinence:  no Fevers:  no Dysuria / urinary frequency:  no   Depression Screen done today and results listed below:     04/03/2023    8:59 AM 03/30/2022   10:21 AM 03/29/2021    8:10 AM 04/23/2020    8:08 AM 03/28/2019    8:51 AM  Depression screen PHQ 2/9  Decreased Interest 0 0 0 0 0  Down, Depressed, Hopeless 0 0 0 0 0  PHQ - 2 Score 0 0 0 0 0  Altered sleeping 0 1 0    Tired, decreased energy 1 2 0    Change in appetite 0 0 0    Feeling bad or failure about yourself  0 0 0    Trouble concentrating 0 0 0    Moving slowly or fidgety/restless 0 0 0    Suicidal thoughts 0 0 0    PHQ-9 Score 1 3 0    Difficult doing work/chores Not difficult at all Not difficult at all Not difficult at all       The patient does not have a history of falls. I did complete a risk assessment for falls. A plan of care for falls was documented.   Past Medical History:  Past Medical History:  Diagnosis Date   Anemia    Skin disease     Surgical History:  Past Surgical History:  Procedure Laterality Date   VAGINA SURGERY      Medications:  Current Outpatient Medications on File Prior to Visit  Medication Sig   cycloSPORINE (RESTASIS) 0.05 % ophthalmic emulsion Place 1 drop in affected eye 2 (two) times daily.   No current facility-administered medications on file prior to visit.    Allergies:  No Known Allergies  Social History:  Social History   Socioeconomic History   Marital status: Single    Spouse name: Not on file   Number of children: Not on file   Years of education: Not on file   Highest education level: Not on file  Occupational History   Not on file  Tobacco Use   Smoking status: Never   Smokeless tobacco: Never  Vaping Use   Vaping status: Never Used  Substance and Sexual Activity  Alcohol use: Yes    Comment: Socially   Drug use: Never   Sexual activity: Yes    Birth control/protection: Condom  Other Topics Concern   Not on file  Social History Narrative   Not on file   Social Determinants of Health   Financial Resource Strain: Not on file  Food Insecurity: Not on file  Transportation Needs: Not on file  Physical Activity: Not on file  Stress: Not on file  Social Connections: Not on file  Intimate Partner Violence: Not on file   Social History   Tobacco Use  Smoking Status Never  Smokeless Tobacco Never   Social History   Substance and Sexual Activity  Alcohol Use Yes   Comment: Socially    Family History:  Family History  Problem Relation Age of Onset   Hypertension Father     Past medical history, surgical history, medications, allergies, family history and social history reviewed with patient today and changes made to  appropriate areas of the chart.   Review of Systems  HENT:  Positive for tinnitus. Negative for congestion, ear pain and sore throat.   Musculoskeletal:  Positive for back pain.   All other ROS negative except what is listed above and in the HPI.      Objective:    BP 96/62   Pulse 66   Temp 98.7 F (37.1 C) (Oral)   Ht 5' 2.2" (1.58 m)   Wt 166 lb (75.3 kg)   LMP 03/13/2023 (Approximate)   SpO2 99%   BMI 30.17 kg/m   Wt Readings from Last 3 Encounters:  04/03/23 166 lb (75.3 kg)  03/30/22 165 lb 8 oz (75.1 kg)  03/29/21 157 lb (71.2 kg)    Physical Exam Vitals and nursing note reviewed.  Constitutional:      General: She is awake. She is not in acute distress.    Appearance: Normal appearance. She is well-developed. She is not ill-appearing.  HENT:     Head: Normocephalic and atraumatic.     Right Ear: Hearing, ear canal and external ear normal. No drainage. A middle ear effusion is present.     Left Ear: Hearing, ear canal and external ear normal. No drainage. A middle ear effusion is present.     Nose: Nose normal.     Right Sinus: No maxillary sinus tenderness or frontal sinus tenderness.     Left Sinus: No maxillary sinus tenderness or frontal sinus tenderness.     Mouth/Throat:     Mouth: Mucous membranes are moist.     Pharynx: Oropharynx is clear. Uvula midline. No pharyngeal swelling, oropharyngeal exudate or posterior oropharyngeal erythema.  Eyes:     General: Lids are normal.        Right eye: No discharge.        Left eye: No discharge.     Extraocular Movements: Extraocular movements intact.     Conjunctiva/sclera: Conjunctivae normal.     Pupils: Pupils are equal, round, and reactive to light.     Visual Fields: Right eye visual fields normal and left eye visual fields normal.  Neck:     Thyroid: No thyromegaly.     Vascular: No carotid bruit.     Trachea: Trachea normal.  Cardiovascular:     Rate and Rhythm: Normal rate and regular rhythm.      Heart sounds: Normal heart sounds. No murmur heard.    No gallop.  Pulmonary:     Effort: Pulmonary effort is normal.  No accessory muscle usage or respiratory distress.     Breath sounds: Normal breath sounds.  Chest:  Breasts:    Right: Normal.     Left: Normal.  Abdominal:     General: Bowel sounds are normal.     Palpations: Abdomen is soft. There is no hepatomegaly or splenomegaly.     Tenderness: There is no abdominal tenderness.  Musculoskeletal:        General: Normal range of motion.     Cervical back: Normal range of motion and neck supple.     Lumbar back: No swelling, edema, deformity, signs of trauma, lacerations, spasms, tenderness or bony tenderness. Normal range of motion. Negative right straight leg raise test and negative left straight leg raise test. No scoliosis.     Right lower leg: No edema.     Left lower leg: No edema.  Lymphadenopathy:     Head:     Right side of head: No submental, submandibular, tonsillar, preauricular or posterior auricular adenopathy.     Left side of head: No submental, submandibular, tonsillar, preauricular or posterior auricular adenopathy.     Cervical: No cervical adenopathy.     Upper Body:     Right upper body: No supraclavicular, axillary or pectoral adenopathy.     Left upper body: No supraclavicular, axillary or pectoral adenopathy.  Skin:    General: Skin is warm and dry.     Capillary Refill: Capillary refill takes less than 2 seconds.     Findings: No rash.  Neurological:     Mental Status: She is alert and oriented to person, place, and time.     Gait: Gait is intact.  Psychiatric:        Attention and Perception: Attention normal.        Mood and Affect: Mood normal.        Speech: Speech normal.        Behavior: Behavior normal. Behavior is cooperative.        Thought Content: Thought content normal.        Judgment: Judgment normal.     Results for orders placed or performed in visit on 03/30/22  CBC with  Differential/Platelet  Result Value Ref Range   WBC 5.2 3.4 - 10.8 x10E3/uL   RBC 4.41 3.77 - 5.28 x10E6/uL   Hemoglobin 12.4 11.1 - 15.9 g/dL   Hematocrit 16.1 09.6 - 46.6 %   MCV 89 79 - 97 fL   MCH 28.1 26.6 - 33.0 pg   MCHC 31.5 31.5 - 35.7 g/dL   RDW 04.5 40.9 - 81.1 %   Platelets 211 150 - 450 x10E3/uL   Neutrophils 61 Not Estab. %   Lymphs 27 Not Estab. %   Monocytes 7 Not Estab. %   Eos 4 Not Estab. %   Basos 1 Not Estab. %   Neutrophils Absolute 3.3 1.4 - 7.0 x10E3/uL   Lymphocytes Absolute 1.4 0.7 - 3.1 x10E3/uL   Monocytes Absolute 0.3 0.1 - 0.9 x10E3/uL   EOS (ABSOLUTE) 0.2 0.0 - 0.4 x10E3/uL   Basophils Absolute 0.0 0.0 - 0.2 x10E3/uL   Immature Granulocytes 0 Not Estab. %   Immature Grans (Abs) 0.0 0.0 - 0.1 x10E3/uL  Comprehensive metabolic panel  Result Value Ref Range   Glucose 94 70 - 99 mg/dL   BUN 15 6 - 20 mg/dL   Creatinine, Ser 9.14 0.57 - 1.00 mg/dL   eGFR 782 >95 AO/ZHY/8.65   BUN/Creatinine Ratio 22 9 - 23  Sodium 139 134 - 144 mmol/L   Potassium 4.3 3.5 - 5.2 mmol/L   Chloride 100 96 - 106 mmol/L   CO2 25 20 - 29 mmol/L   Calcium 9.2 8.7 - 10.2 mg/dL   Total Protein 7.0 6.0 - 8.5 g/dL   Albumin 4.5 3.9 - 4.9 g/dL   Globulin, Total 2.5 1.5 - 4.5 g/dL   Albumin/Globulin Ratio 1.8 1.2 - 2.2   Bilirubin Total 0.5 0.0 - 1.2 mg/dL   Alkaline Phosphatase 96 44 - 121 IU/L   AST 18 0 - 40 IU/L   ALT 15 0 - 32 IU/L  Lipid panel  Result Value Ref Range   Cholesterol, Total 171 100 - 199 mg/dL   Triglycerides 914 0 - 149 mg/dL   HDL 50 >78 mg/dL   VLDL Cholesterol Cal 20 5 - 40 mg/dL   LDL Chol Calc (NIH) 295 (H) 0 - 99 mg/dL   Chol/HDL Ratio 3.4 0.0 - 4.4 ratio  TSH  Result Value Ref Range   TSH 2.460 0.450 - 4.500 uIU/mL  Urinalysis, Routine w reflex microscopic  Result Value Ref Range   Specific Gravity, UA >1.030 (H) 1.005 - 1.030   pH, UA 5.5 5.0 - 7.5   Color, UA Yellow Yellow   Appearance Ur Clear Clear   Leukocytes,UA Negative  Negative   Protein,UA Negative Negative/Trace   Glucose, UA Negative Negative   Ketones, UA Negative Negative   RBC, UA Negative Negative   Bilirubin, UA Negative Negative   Urobilinogen, Ur 0.2 0.2 - 1.0 mg/dL   Nitrite, UA Negative Negative   Microscopic Examination Comment   Vitamin D (25 hydroxy)  Result Value Ref Range   Vit D, 25-Hydroxy 16.6 (L) 30.0 - 100.0 ng/mL      Assessment & Plan:   Problem List Items Addressed This Visit       Other   Screening for ischemic heart disease   Relevant Orders   Lipid panel   Other Visit Diagnoses     Annual physical exam    -  Primary   Health maintenance reviewed during visit today.  Labs ordered.  Vaccines up to date. PAP up to date.   Relevant Orders   CBC with Differential/Platelet   Comprehensive metabolic panel   Lipid panel   TSH   Urinalysis, Routine w reflex microscopic   Chronic left-sided low back pain without sciatica       Recommend using heating pad, tylenol/motrin PRN. Xray ordered. Exercises given to help with hamstring flexibility. Follow up if not improved.   Relevant Orders   DG Lumbar Spine 2-3 Views        Follow up plan: Return in about 1 year (around 04/02/2024) for Physical and Fasting labs.   LABORATORY TESTING:  - Pap smear: up to date  IMMUNIZATIONS:   - Tdap: Tetanus vaccination status reviewed: last tetanus booster within 10 years. - Influenza: Up to date - Pneumovax: Not applicable - Prevnar: Not applicable - COVID: Refused - HPV: Not applicable - Shingrix vaccine: Not applicable  SCREENING: -Mammogram: Not applicable  - Colonoscopy: Not applicable  - Bone Density: Not applicable  -Hearing Test: Not applicable  -Spirometry: Not applicable   PATIENT COUNSELING:   Advised to take 1 mg of folate supplement per day if capable of pregnancy.   Sexuality: Discussed sexually transmitted diseases, partner selection, use of condoms, avoidance of unintended pregnancy  and contraceptive  alternatives.   Advised to avoid cigarette smoking.  I  discussed with the patient that most people either abstain from alcohol or drink within safe limits (<=14/week and <=4 drinks/occasion for males, <=7/weeks and <= 3 drinks/occasion for females) and that the risk for alcohol disorders and other health effects rises proportionally with the number of drinks per week and how often a drinker exceeds daily limits.  Discussed cessation/primary prevention of drug use and availability of treatment for abuse.   Diet: Encouraged to adjust caloric intake to maintain  or achieve ideal body weight, to reduce intake of dietary saturated fat and total fat, to limit sodium intake by avoiding high sodium foods and not adding table salt, and to maintain adequate dietary potassium and calcium preferably from fresh fruits, vegetables, and low-fat dairy products.    stressed the importance of regular exercise  Injury prevention: Discussed safety belts, safety helmets, smoke detector, smoking near bedding or upholstery.   Dental health: Discussed importance of regular tooth brushing, flossing, and dental visits.    NEXT PREVENTATIVE PHYSICAL DUE IN 1 YEAR. Return in about 1 year (around 04/02/2024) for Physical and Fasting labs.

## 2023-04-04 LAB — LIPID PANEL
Chol/HDL Ratio: 3.7 ratio (ref 0.0–4.4)
Cholesterol, Total: 164 mg/dL (ref 100–199)
HDL: 44 mg/dL (ref >39)
LDL Chol Calc (NIH): 102 mg/dL — ABNORMAL HIGH (ref 0–99)
Triglycerides: 98 mg/dL (ref 0–149)
VLDL Cholesterol Cal: 18 mg/dL (ref 5–40)

## 2023-04-04 LAB — COMPREHENSIVE METABOLIC PANEL
ALT: 15 [IU]/L (ref 0–32)
AST: 14 [IU]/L (ref 0–40)
Albumin: 4.2 g/dL (ref 3.9–4.9)
Alkaline Phosphatase: 92 [IU]/L (ref 44–121)
BUN/Creatinine Ratio: 21 (ref 9–23)
BUN: 14 mg/dL (ref 6–20)
Bilirubin Total: 0.6 mg/dL (ref 0.0–1.2)
CO2: 26 mmol/L (ref 20–29)
Calcium: 9.2 mg/dL (ref 8.7–10.2)
Chloride: 104 mmol/L (ref 96–106)
Creatinine, Ser: 0.66 mg/dL (ref 0.57–1.00)
Globulin, Total: 2.6 g/dL (ref 1.5–4.5)
Glucose: 97 mg/dL (ref 70–99)
Potassium: 4.3 mmol/L (ref 3.5–5.2)
Sodium: 139 mmol/L (ref 134–144)
Total Protein: 6.8 g/dL (ref 6.0–8.5)
eGFR: 117 mL/min/{1.73_m2} (ref >59)

## 2023-04-04 LAB — CBC WITH DIFFERENTIAL/PLATELET
Basophils Absolute: 0 10*3/uL (ref 0.0–0.2)
Basos: 1 %
EOS (ABSOLUTE): 0.2 10*3/uL (ref 0.0–0.4)
Eos: 3 %
Hematocrit: 39.5 % (ref 34.0–46.6)
Hemoglobin: 12.7 g/dL (ref 11.1–15.9)
Immature Grans (Abs): 0 10*3/uL (ref 0.0–0.1)
Immature Granulocytes: 0 %
Lymphocytes Absolute: 1.5 10*3/uL (ref 0.7–3.1)
Lymphs: 26 %
MCH: 29.2 pg (ref 26.6–33.0)
MCHC: 32.2 g/dL (ref 31.5–35.7)
MCV: 91 fL (ref 79–97)
Monocytes Absolute: 0.4 10*3/uL (ref 0.1–0.9)
Monocytes: 8 %
Neutrophils Absolute: 3.7 10*3/uL (ref 1.4–7.0)
Neutrophils: 62 %
Platelets: 214 10*3/uL (ref 150–450)
RBC: 4.35 x10E6/uL (ref 3.77–5.28)
RDW: 12.1 % (ref 11.7–15.4)
WBC: 5.9 10*3/uL (ref 3.4–10.8)

## 2023-04-04 LAB — TSH: TSH: 2.09 u[IU]/mL (ref 0.450–4.500)

## 2023-04-07 ENCOUNTER — Ambulatory Visit
Admission: RE | Admit: 2023-04-07 | Discharge: 2023-04-07 | Disposition: A | Discharge status: Home / Self Care | Payer: 59 | Attending: Nurse Practitioner | Admitting: Nurse Practitioner

## 2023-04-07 ENCOUNTER — Ambulatory Visit
Admission: RE | Admit: 2023-04-07 | Discharge: 2023-04-07 | Disposition: A | Discharge status: Home / Self Care | Payer: 59 | Source: Ambulatory Visit | Attending: Nurse Practitioner

## 2023-04-07 DIAGNOSIS — M545 Low back pain, unspecified: Secondary | ICD-10-CM | POA: Insufficient documentation

## 2023-04-07 DIAGNOSIS — M4807 Spinal stenosis, lumbosacral region: Secondary | ICD-10-CM | POA: Diagnosis not present

## 2023-04-07 DIAGNOSIS — G8929 Other chronic pain: Secondary | ICD-10-CM | POA: Insufficient documentation

## 2023-04-07 DIAGNOSIS — M48061 Spinal stenosis, lumbar region without neurogenic claudication: Secondary | ICD-10-CM | POA: Diagnosis not present

## 2023-04-09 ENCOUNTER — Encounter: Payer: Self-pay | Admitting: Nurse Practitioner

## 2023-04-09 DIAGNOSIS — G8929 Other chronic pain: Secondary | ICD-10-CM

## 2023-04-28 ENCOUNTER — Other Ambulatory Visit: Payer: Self-pay

## 2023-04-28 DIAGNOSIS — M5416 Radiculopathy, lumbar region: Secondary | ICD-10-CM | POA: Diagnosis not present

## 2023-04-28 MED ORDER — MELOXICAM 7.5 MG PO TABS
7.5000 mg | ORAL_TABLET | Freq: Every day | ORAL | 0 refills | Status: DC
  Filled 2023-04-28: qty 30, 30d supply, fill #0

## 2023-04-28 MED ORDER — TIZANIDINE HCL 4 MG PO TABS
4.0000 mg | ORAL_TABLET | Freq: Four times a day (QID) | ORAL | 0 refills | Status: DC
  Filled 2023-04-28: qty 120, 30d supply, fill #0

## 2023-08-09 ENCOUNTER — Other Ambulatory Visit: Payer: Self-pay

## 2023-08-09 DIAGNOSIS — H5213 Myopia, bilateral: Secondary | ICD-10-CM | POA: Diagnosis not present

## 2023-08-09 DIAGNOSIS — H04129 Dry eye syndrome of unspecified lacrimal gland: Secondary | ICD-10-CM | POA: Diagnosis not present

## 2023-08-09 MED ORDER — CYCLOSPORINE 0.05 % OP EMUL
1.0000 [drp] | Freq: Two times a day (BID) | OPHTHALMIC | 3 refills | Status: DC
  Filled 2023-08-09 – 2023-08-14 (×2): qty 60, 30d supply, fill #0

## 2023-08-14 ENCOUNTER — Other Ambulatory Visit: Payer: Self-pay

## 2024-04-03 ENCOUNTER — Ambulatory Visit: Payer: Self-pay | Admitting: Nurse Practitioner

## 2024-04-03 VITALS — BP 94/62 | HR 62 | Temp 99.6°F | Ht 62.1 in | Wt 165.8 lb

## 2024-04-03 DIAGNOSIS — Z136 Encounter for screening for cardiovascular disorders: Secondary | ICD-10-CM

## 2024-04-03 DIAGNOSIS — Z0000 Encounter for general adult medical examination without abnormal findings: Secondary | ICD-10-CM | POA: Diagnosis not present

## 2024-04-03 NOTE — Progress Notes (Signed)
 BP 94/62   Pulse 62   Temp 99.6 F (37.6 C) (Oral)   Ht 5' 2.1 (1.577 m)   Wt 165 lb 12.8 oz (75.2 kg)   LMP 03/13/2024 (Approximate)   SpO2 96%   BMI 30.23 kg/m    Subjective:    Patient ID: Meagan White, female    DOB: [birth year], 38 y.o.   MRN: [id number]  HPI: Meagan White is a 38 y.o. female presenting on 04/03/2024 for comprehensive medical examination. Current medical complaints include:none  She currently lives with: Menopausal Symptoms: no    Denies CP, SOB, dizziness, palpitations, visual changes, and lower extremity swelling.  Does have migraines.   Depression Screen done today and results listed below:     04/03/2024    8:48 AM 04/03/2023    8:59 AM 03/30/2022   10:21 AM 03/29/2021    8:10 AM 04/23/2020    8:08 AM  Depression screen PHQ 2/9  Decreased Interest 0 0 0 0 0  Down, Depressed, Hopeless 0 0 0 0 0  PHQ - 2 Score 0 0 0 0 0  Altered sleeping 0 0 1 0   Tired, decreased energy 0 1 2 0   Change in appetite 0 0 0 0   Feeling bad or failure about yourself  0 0 0 0   Trouble concentrating 0 0 0 0   Moving slowly or fidgety/restless 0 0 0 0   Suicidal thoughts 0 0 0 0   PHQ-9 Score 0 1 3 0   Difficult doing work/chores Not difficult at all Not difficult at all Not difficult at all Not difficult at all     The patient does not have a history of falls. I did complete a risk assessment for falls. A plan of care for falls was documented.   Past Medical History:  Past Medical History:  Diagnosis Date   Anemia    During pregnancy   Skin disease     Surgical History:  Past Surgical History:  Procedure Laterality Date   VAGINA SURGERY      Medications:  No current outpatient medications on file prior to visit.   No current facility-administered medications on file prior to visit.    Allergies:  No Known Allergies  Social History:  Social History   Socioeconomic History   Marital status: Single    Spouse name: Not on  file   Number of children: Not on file   Years of education: Not on file   Highest education level: Associate degree: occupational, scientist, product/process development, or vocational program  Occupational History   Not on file  Tobacco Use   Smoking status: Never   Smokeless tobacco: Never  Vaping Use   Vaping status: Never Used  Substance and Sexual Activity   Alcohol use: Not Currently    Comment: Socially   Drug use: Never   Sexual activity: Yes    Birth control/protection: Condom  Other Topics Concern   Not on file  Social History Narrative   Not on file   Social Drivers of Health   Financial Resource Strain: Low Risk  (04/03/2024)   Overall Financial Resource Strain (CARDIA)    Difficulty of Paying Living Expenses: Not hard at all  Food Insecurity: No Food Insecurity (04/03/2024)   Hunger Vital Sign    Worried About Running Out of Food in the Last Year: Never true    Ran Out of Food in the Last Year: Never true  Transportation Needs: No  Transportation Needs (04/03/2024)   PRAPARE - Administrator, Civil Service (Medical): No    Lack of Transportation (Non-Medical): No  Physical Activity: Insufficiently Active (04/03/2024)   Exercise Vital Sign    Days of Exercise per Week: 2 days    Minutes of Exercise per Session: 20 min  Stress: No Stress Concern Present (04/03/2024)   [employer] of Occupational Health - Occupational Stress Questionnaire    Feeling of Stress: Not at all  Social Connections: Moderately Integrated (04/03/2024)   Social Connection and Isolation Panel    Frequency of Communication with Friends and Family: More than three times a week    Frequency of Social Gatherings with Friends and Family: Twice a week    Attends Religious Services: More than 4 times per year    Active Member of [employer] or Organizations: No    Attends Engineer, Structural: Not on file    Marital Status: Living with partner  Intimate Partner Violence: Not At Risk (04/03/2024)    Humiliation, Afraid, Rape, and Kick questionnaire    Fear of Current or Ex-Partner: No    Emotionally Abused: No    Physically Abused: No    Sexually Abused: No   Social History   Tobacco Use  Smoking Status Never  Smokeless Tobacco Never   Social History   Substance and Sexual Activity  Alcohol Use Not Currently   Comment: Socially    Family History:  Family History  Problem Relation Age of Onset   Hypertension Father    Diabetes Father     Past medical history, surgical history, medications, allergies, family history and social history reviewed with patient today and changes made to appropriate areas of the chart.   Review of Systems  Eyes:  Negative for blurred vision and double vision.  Respiratory:  Negative for shortness of breath.   Cardiovascular:  Negative for chest pain, palpitations and leg swelling.  Neurological:  Positive for headaches. Negative for dizziness.   All other ROS negative except what is listed above and in the HPI.      Objective:    BP 94/62   Pulse 62   Temp 99.6 F (37.6 C) (Oral)   Ht 5' 2.1 (1.577 m)   Wt 165 lb 12.8 oz (75.2 kg)   LMP 03/13/2024 (Approximate)   SpO2 96%   BMI 30.23 kg/m   Wt Readings from Last 3 Encounters:  04/03/24 165 lb 12.8 oz (75.2 kg)  04/03/23 166 lb (75.3 kg)  03/30/22 165 lb 8 oz (75.1 kg)    Physical Exam Vitals and nursing note reviewed.  Constitutional:      General: She is awake. She is not in acute distress.    Appearance: Normal appearance. She is well-developed. She is not ill-appearing.  HENT:     Head: Normocephalic and atraumatic.     Right Ear: Hearing, tympanic membrane, ear canal and external ear normal. No drainage.     Left Ear: Hearing, tympanic membrane, ear canal and external ear normal. No drainage.     Nose: Nose normal.     Right Sinus: No maxillary sinus tenderness or frontal sinus tenderness.     Left Sinus: No maxillary sinus tenderness or frontal sinus tenderness.      Mouth/Throat:     Mouth: Mucous membranes are moist.     Pharynx: Oropharynx is clear. Uvula midline. No pharyngeal swelling, oropharyngeal exudate or posterior oropharyngeal erythema.  Eyes:     General: Lids  are normal.        Right eye: No discharge.        Left eye: No discharge.     Extraocular Movements: Extraocular movements intact.     Conjunctiva/sclera: Conjunctivae normal.     Pupils: Pupils are equal, round, and reactive to light.     Visual Fields: Right eye visual fields normal and left eye visual fields normal.  Neck:     Thyroid: No thyromegaly.     Vascular: No carotid bruit.     Trachea: Trachea normal.  Cardiovascular:     Rate and Rhythm: Normal rate and regular rhythm.     Heart sounds: Normal heart sounds. No murmur heard.    No gallop.  Pulmonary:     Effort: Pulmonary effort is normal. No accessory muscle usage or respiratory distress.     Breath sounds: Normal breath sounds.  Chest:  Breasts:    Right: Normal.     Left: Normal.  Abdominal:     General: Bowel sounds are normal.     Palpations: Abdomen is soft. There is no hepatomegaly or splenomegaly.     Tenderness: There is no abdominal tenderness.  Musculoskeletal:        General: Normal range of motion.     Cervical back: Normal range of motion and neck supple.     Right lower leg: No edema.     Left lower leg: No edema.  Lymphadenopathy:     Head:     Right side of head: No submental, submandibular, tonsillar, preauricular or posterior auricular adenopathy.     Left side of head: No submental, submandibular, tonsillar, preauricular or posterior auricular adenopathy.     Cervical: No cervical adenopathy.     Upper Body:     Right upper body: No supraclavicular, axillary or pectoral adenopathy.     Left upper body: No supraclavicular, axillary or pectoral adenopathy.  Skin:    General: Skin is warm and dry.     Capillary Refill: Capillary refill takes less than 2 seconds.     Findings: No  rash.  Neurological:     Mental Status: She is alert and oriented to person, place, and time.     Gait: Gait is intact.  Psychiatric:        Attention and Perception: Attention normal.        Mood and Affect: Mood normal.        Speech: Speech normal.        Behavior: Behavior normal. Behavior is cooperative.        Thought Content: Thought content normal.        Judgment: Judgment normal.     Results for orders placed or performed in visit on 04/03/23  Microscopic Examination   Collection Time: 04/03/23  9:09 AM   Urine  Result Value Ref Range   WBC, UA 0-5 0 - 5 /hpf   RBC, Urine 0-2 0 - 2 /hpf   Epithelial Cells (non renal) 0-10 0 - 10 /hpf   Mucus, UA Present (A) Not Estab.   Bacteria, UA None seen None seen/Few  Urinalysis, Routine w reflex microscopic   Collection Time: 04/03/23  9:09 AM  Result Value Ref Range   Specific Gravity, UA 1.025 1.005 - 1.030   pH, UA 6.5 5.0 - 7.5   Color, UA Yellow Yellow   Appearance Ur Cloudy (A) Clear   Leukocytes,UA Trace (A) Negative   Protein,UA 1+ (A) Negative/Trace   Glucose, UA  Negative Negative   Ketones, UA Negative Negative   RBC, UA Negative Negative   Bilirubin, UA Negative Negative   Urobilinogen, Ur 1.0 0.2 - 1.0 mg/dL   Nitrite, UA Negative Negative   Microscopic Examination See below:   CBC with Differential/Platelet   Collection Time: 04/03/23  9:10 AM  Result Value Ref Range   WBC 5.9 3.4 - 10.8 x10E3/uL   RBC 4.35 3.77 - 5.28 x10E6/uL   Hemoglobin 12.7 11.1 - 15.9 g/dL   Hematocrit 60.4 65.9 - 46.6 %   MCV 91 79 - 97 fL   MCH 29.2 26.6 - 33.0 pg   MCHC 32.2 31.5 - 35.7 g/dL   RDW 87.8 88.2 - 84.5 %   Platelets 214 150 - 450 x10E3/uL   Neutrophils 62 Not Estab. %   Lymphs 26 Not Estab. %   Monocytes 8 Not Estab. %   Eos 3 Not Estab. %   Basos 1 Not Estab. %   Neutrophils Absolute 3.7 1.4 - 7.0 x10E3/uL   Lymphocytes Absolute 1.5 0.7 - 3.1 x10E3/uL   Monocytes Absolute 0.4 0.1 - 0.9 x10E3/uL   EOS  (ABSOLUTE) 0.2 0.0 - 0.4 x10E3/uL   Basophils Absolute 0.0 0.0 - 0.2 x10E3/uL   Immature Granulocytes 0 Not Estab. %   Immature Grans (Abs) 0.0 0.0 - 0.1 x10E3/uL  Comprehensive metabolic panel   Collection Time: 04/03/23  9:10 AM  Result Value Ref Range   Glucose 97 70 - 99 mg/dL   BUN 14 6 - 20 mg/dL   Creatinine, Ser 9.33 0.57 - 1.00 mg/dL   eGFR 882 >40 fO/fpw/8.26   BUN/Creatinine Ratio 21 9 - 23   Sodium 139 134 - 144 mmol/L   Potassium 4.3 3.5 - 5.2 mmol/L   Chloride 104 96 - 106 mmol/L   CO2 26 20 - 29 mmol/L   Calcium 9.2 8.7 - 10.2 mg/dL   Total Protein 6.8 6.0 - 8.5 g/dL   Albumin 4.2 3.9 - 4.9 g/dL   Globulin, Total 2.6 1.5 - 4.5 g/dL   Bilirubin Total 0.6 0.0 - 1.2 mg/dL   Alkaline Phosphatase 92 44 - 121 IU/L   AST 14 0 - 40 IU/L   ALT 15 0 - 32 IU/L  Lipid panel   Collection Time: 04/03/23  9:10 AM  Result Value Ref Range   Cholesterol, Total 164 100 - 199 mg/dL   Triglycerides 98 0 - 149 mg/dL   HDL 44 >60 mg/dL   VLDL Cholesterol Cal 18 5 - 40 mg/dL   LDL Chol Calc (NIH) 897 (H) 0 - 99 mg/dL   Chol/HDL Ratio 3.7 0.0 - 4.4 ratio  TSH   Collection Time: 04/03/23  9:10 AM  Result Value Ref Range   TSH 2.090 0.450 - 4.500 uIU/mL      Assessment & Plan:   Problem List Items Addressed This Visit       Other   Screening for ischemic heart disease   Relevant Orders   Lipid panel   Other Visit Diagnoses       Annual physical exam    -  Primary   Health maintenance reviewed.  Labs ordered.  Vaccines reviewed.  PAP up to date.   Relevant Orders   CBC with Differential/Platelet   Comprehensive metabolic panel with GFR   Lipid panel   TSH        Follow up plan: Return in about 1 year (around 04/03/2025) for Physical and Fasting labs.  LABORATORY TESTING:  - Pap smear: up to date  IMMUNIZATIONS:   - Tdap: Tetanus vaccination status reviewed: last tetanus booster within 10 years. - Influenza: Up to date - Pneumovax: Not applicable - Prevnar:  Not applicable - COVID: Not applicable - HPV: Refused - Shingrix vaccine: Not applicable  SCREENING: -Mammogram: Not applicable  - Colonoscopy: Not applicable  - Bone Density: Not applicable  -Hearing Test: Not applicable  -Spirometry: Not applicable   PATIENT COUNSELING:   Advised to take 1 mg of folate supplement per day if capable of pregnancy.   Sexuality: Discussed sexually transmitted diseases, partner selection, use of condoms, avoidance of unintended pregnancy  and contraceptive alternatives.   Advised to avoid cigarette smoking.  I discussed with the patient that most people either abstain from alcohol or drink within safe limits (<=14/week and <=4 drinks/occasion for males, <=7/weeks and <= 3 drinks/occasion for females) and that the risk for alcohol disorders and other health effects rises proportionally with the number of drinks per week and how often a drinker exceeds daily limits.  Discussed cessation/primary prevention of drug use and availability of treatment for abuse.   Diet: Encouraged to adjust caloric intake to maintain  or achieve ideal body weight, to reduce intake of dietary saturated fat and total fat, to limit sodium intake by avoiding high sodium foods and not adding table salt, and to maintain adequate dietary potassium and calcium preferably from fresh fruits, vegetables, and low-fat dairy products.    stressed the importance of regular exercise  Injury prevention: Discussed safety belts, safety helmets, smoke detector, smoking near bedding or upholstery.   Dental health: Discussed importance of regular tooth brushing, flossing, and dental visits.    NEXT PREVENTATIVE PHYSICAL DUE IN 1 YEAR. Return in about 1 year (around 04/03/2025) for Physical and Fasting labs.

## 2024-04-04 ENCOUNTER — Ambulatory Visit: Payer: Self-pay | Admitting: Nurse Practitioner

## 2024-04-04 LAB — COMPREHENSIVE METABOLIC PANEL WITH GFR
ALT: 20 IU/L (ref 0–32)
AST: 18 IU/L (ref 0–40)
Albumin: 4.3 g/dL (ref 3.9–4.9)
Alkaline Phosphatase: 88 IU/L (ref 41–116)
BUN/Creatinine Ratio: 23 (ref 9–23)
BUN: 15 mg/dL (ref 6–20)
Bilirubin Total: 0.4 mg/dL (ref 0.0–1.2)
CO2: 24 mmol/L (ref 20–29)
Calcium: 8.8 mg/dL (ref 8.7–10.2)
Chloride: 102 mmol/L (ref 96–106)
Creatinine, Ser: 0.65 mg/dL (ref 0.57–1.00)
Globulin, Total: 2.4 g/dL (ref 1.5–4.5)
Glucose: 95 mg/dL (ref 70–99)
Potassium: 4.2 mmol/L (ref 3.5–5.2)
Sodium: 137 mmol/L (ref 134–144)
Total Protein: 6.7 g/dL (ref 6.0–8.5)
eGFR: 117 mL/min/1.73 (ref >59)

## 2024-04-04 LAB — CBC WITH DIFFERENTIAL/PLATELET
Basophils Absolute: 0 x10E3/uL (ref 0.0–0.2)
Basos: 1 %
EOS (ABSOLUTE): 0.1 x10E3/uL (ref 0.0–0.4)
Eos: 3 %
Hematocrit: 37.9 % (ref 34.0–46.6)
Hemoglobin: 12.4 g/dL (ref 11.1–15.9)
Immature Grans (Abs): 0 x10E3/uL (ref 0.0–0.1)
Immature Granulocytes: 0 %
Lymphocytes Absolute: 1.4 x10E3/uL (ref 0.7–3.1)
Lymphs: 28 %
MCH: 29.9 pg (ref 26.6–33.0)
MCHC: 32.7 g/dL (ref 31.5–35.7)
MCV: 91 fL (ref 79–97)
Monocytes Absolute: 0.3 x10E3/uL (ref 0.1–0.9)
Monocytes: 7 %
Neutrophils Absolute: 3.2 x10E3/uL (ref 1.4–7.0)
Neutrophils: 61 %
Platelets: 236 x10E3/uL (ref 150–450)
RBC: 4.15 x10E6/uL (ref 3.77–5.28)
RDW: 11.9 % (ref 11.7–15.4)
WBC: 5.1 x10E3/uL (ref 3.4–10.8)

## 2024-04-04 LAB — LIPID PANEL
Chol/HDL Ratio: 4.1 ratio (ref 0.0–4.4)
Cholesterol, Total: 183 mg/dL (ref 100–199)
HDL: 45 mg/dL (ref >39)
LDL Chol Calc (NIH): 116 mg/dL — ABNORMAL HIGH (ref 0–99)
Triglycerides: 125 mg/dL (ref 0–149)
VLDL Cholesterol Cal: 22 mg/dL (ref 5–40)

## 2024-04-04 LAB — TSH: TSH: 3.25 u[IU]/mL (ref 0.450–4.500)

## 2025-04-07 ENCOUNTER — Encounter: Admitting: Nurse Practitioner
